# Patient Record
Sex: Female | Born: 1990 | Race: White | Hispanic: No | Marital: Single | Smoking: Current every day smoker
Health system: Southern US, Community
[De-identification: ages and names within clinical notes are randomized; demographics above are authoritative.]

## PROBLEM LIST (undated history)

## (undated) DIAGNOSIS — F32A Depression, unspecified: Secondary | ICD-10-CM

## (undated) DIAGNOSIS — N200 Calculus of kidney: Secondary | ICD-10-CM

## (undated) DIAGNOSIS — B019 Varicella without complication: Secondary | ICD-10-CM

## (undated) DIAGNOSIS — N390 Urinary tract infection, site not specified: Secondary | ICD-10-CM

## (undated) DIAGNOSIS — F329 Major depressive disorder, single episode, unspecified: Secondary | ICD-10-CM

## (undated) HISTORY — DX: Depression, unspecified: F32.A

## (undated) HISTORY — DX: Urinary tract infection, site not specified: N39.0

## (undated) HISTORY — DX: Varicella without complication: B01.9

## (undated) HISTORY — DX: Major depressive disorder, single episode, unspecified: F32.9

## (undated) HISTORY — DX: Calculus of kidney: N20.0

---

## 2010-06-15 HISTORY — PX: KIDNEY STONE SURGERY: SHX686

## 2015-09-20 ENCOUNTER — Encounter: Payer: Self-pay | Admitting: Primary Care

## 2015-09-20 ENCOUNTER — Ambulatory Visit (INDEPENDENT_AMBULATORY_CARE_PROVIDER_SITE_OTHER): Payer: 59 | Admitting: Primary Care

## 2015-09-20 VITALS — BP 108/64 | HR 87 | Temp 98.1°F | Ht 61.5 in | Wt 162.4 lb

## 2015-09-20 DIAGNOSIS — F32A Depression, unspecified: Secondary | ICD-10-CM

## 2015-09-20 DIAGNOSIS — F418 Other specified anxiety disorders: Secondary | ICD-10-CM | POA: Diagnosis not present

## 2015-09-20 DIAGNOSIS — F329 Major depressive disorder, single episode, unspecified: Secondary | ICD-10-CM | POA: Insufficient documentation

## 2015-09-20 DIAGNOSIS — F419 Anxiety disorder, unspecified: Principal | ICD-10-CM

## 2015-09-20 MED ORDER — FLUOXETINE HCL 20 MG PO TABS: 20.0000 mg | ORAL_TABLET | Freq: Every day | ORAL | Status: DC

## 2015-09-20 NOTE — Progress Notes (Signed)
Pre visit review using our clinic review tool, if applicable. No additional management support is needed unless otherwise documented below in the visit note. 

## 2015-09-20 NOTE — Patient Instructions (Addendum)
Start Fluoxetine 20 mg tablets for anxiety and depression. Start by taking 1/2 tablet for 6 days, then advance to 1 full tablet thereafter.   You will be contacted regarding your referral to Therapy.  Please let us know if you have not heard back within one week.   Schedule a follow up appointment in 6 weeks.   It was a pleasure to meet you today! Please don't hesitate to call me with any questions. Welcome to [facility]!

## 2015-09-20 NOTE — Progress Notes (Signed)
Subjective:    Patient ID: Rachel Mann, female    DOB: 1990/09/05, 25 y.o.   MRN: [id number]  HPI  [name] is a 25 year old female who presents today to establish care and discuss the problems mentioned below. Will obtain old records. Her last physical was years ago.   1) Anxiety and Depression: Diagnosed 4 years ago and was once managed on medication for which she cannot remember. She stopped taking her medication 4 years ago as she could no longer afford. She continues to struggle with depression and anxiety. She has difficulty getting out of bed and does not want to socialize, she feels anxious daily, has daily worry with difficulty controlling, and difficulty sleeping.  GAD 7 score of 16 and PHQ 9 score of 18 today. Denies SI/HI today, but has thoughts of this 1 month ago. She is interested in resuming medication as she was very successful with this in the past.    Review of Systems  Respiratory: Negative for shortness of breath.   Cardiovascular: Negative for chest pain.  Gastrointestinal:       Occasionally bloating with certain foods.  Genitourinary:       IUD, does spot occasionally  Allergic/Immunologic: Positive for environmental allergies.  Psychiatric/Behavioral: Positive for sleep disturbance. Negative for suicidal ideas. The patient is nervous/anxious.        Past Medical History  Diagnosis Date  . Chickenpox   . Depression   . Kidney stone   . UTI (lower urinary tract infection)     Social History   Social History  . Marital Status: Single    Spouse Name: N/A  . Number of Children: N/A  . Years of Education: N/A   Occupational History  . Not on file.   Social History Main Topics  . Smoking status: Current Every Day Smoker -- 0.25 packs/day    Types: Cigarettes  . Smokeless tobacco: Not on file  . Alcohol Use: 0.0 oz/week    0 Standard drinks or equivalent per week     Comment: soical  . Drug Use: No  . Sexual Activity: Not on file    Other Topics Concern  . Not on file   Social History Narrative   Divorced.   1 son.   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    Enjoys reading, crafts.     Past Surgical History  Procedure Laterality Date  . Kidney stone surgery  2012    Family History  Problem Relation Age of Onset  . Alcohol abuse Father   . Mental illness Maternal Grandmother   . Diabetes Maternal Grandmother     No Known Allergies  No current outpatient prescriptions on file prior to visit.   No current facility-administered medications on file prior to visit.    BP 108/64 mmHg  Pulse 87  Temp(Src) 98.1 F (36.7 C) (Oral)  Ht 5' 1.5" (1.562 m)  Wt 162 lb 6.4 oz (73.664 kg)  BMI 30.19 kg/m2  SpO2 99%    Objective:   Physical Exam  Constitutional: She is oriented to person, place, and time. She appears well-nourished.  Neck: Neck supple.  Cardiovascular: Normal rate and regular rhythm.   Pulmonary/Chest: Effort normal and breath sounds normal.  Neurological: She is alert and oriented to person, place, and time.  Skin: Skin is warm and dry.  Psychiatric: She has a normal mood and affect.  Assessment & Plan:

## 2015-09-20 NOTE — Assessment & Plan Note (Addendum)
Diagnosed years ago, successful on medication for which she cannot remember. No SI/HI thoughts today, although has experienced 1 month ago. Contracts to safety today. Given PHQ 9 score of 18 and GAD 7 score of 16, will initiate treatment.  Start Fluoxetine tablets. Patient is to take 1/2 tablet daily for 6 days, then advance to 1 full tablet thereafter. We discussed possible side effects of headache, GI upset, drowsiness, and SI/HI. If thoughts of SI/HI develop, we discussed to present to the emergency immediately. Patient verbalized understanding.   Follow up in 6 weeks for re-evaluation.

## 2015-10-18 ENCOUNTER — Ambulatory Visit (INDEPENDENT_AMBULATORY_CARE_PROVIDER_SITE_OTHER): Payer: 59 | Admitting: Psychology

## 2015-10-18 DIAGNOSIS — F331 Major depressive disorder, recurrent, moderate: Secondary | ICD-10-CM

## 2015-10-24 ENCOUNTER — Telehealth: Payer: Self-pay

## 2015-10-24 NOTE — Telephone Encounter (Signed)
I would continue to take 1/2 daily or every other day until she is seen.  Please call us back if symptoms worsen prior to her appointment.

## 2015-10-24 NOTE — Telephone Encounter (Signed)
Left detailed message on voicemail.  

## 2015-10-24 NOTE — Telephone Encounter (Signed)
Pt was seen 09/20/15 to establish care; pt started prozac; about 2 weeks after starting med pt began to feel anxious. Last night pt said anxiousness worsened; pt had panic attack and pt felt disorientated and disconnected from her body. Now pt feeling better but still feels anxious (not as anxious as last night). NO SI/HI. Pt has already taken Prozac 20 mg 1/2 tab this morning. Pt request cb with how to proceed. Pt has f/u appt scheduled with Mayra ReelKate Clark NP on 10/30/15. [facility]. If pt condition changes or worsens prior to cb pt will call [facility]. Mayra ReelKate Clark out of office until 10/29/15.

## 2015-10-30 ENCOUNTER — Encounter: Payer: Self-pay | Admitting: Primary Care

## 2015-10-30 ENCOUNTER — Ambulatory Visit (INDEPENDENT_AMBULATORY_CARE_PROVIDER_SITE_OTHER): Payer: 59 | Admitting: Primary Care

## 2015-10-30 VITALS — BP 112/74 | HR 86 | Temp 97.8°F | Ht 62.0 in | Wt 162.0 lb

## 2015-10-30 DIAGNOSIS — F418 Other specified anxiety disorders: Secondary | ICD-10-CM | POA: Diagnosis not present

## 2015-10-30 DIAGNOSIS — F419 Anxiety disorder, unspecified: Principal | ICD-10-CM

## 2015-10-30 DIAGNOSIS — F329 Major depressive disorder, single episode, unspecified: Secondary | ICD-10-CM

## 2015-10-30 MED ORDER — ESCITALOPRAM OXALATE 10 MG PO TABS: 10.0000 mg | ORAL_TABLET | Freq: Every day | ORAL | Status: DC

## 2015-10-30 NOTE — Progress Notes (Signed)
   Subjective:    Patient ID: Rachel Mann, female    DOB: 1991/04/21, 25 y.o.   MRN: [id number]  HPI  Ms. Rachel Mann is a 25 year old female who presents today for follow up of anxiety and depression. She was evaluated in April 2017 with a PHQ 9 sore of 18 and GAD 7 score of 16. She was initiated on Prozac 10 mg with an increase to 20 mg after 1 week.   She called into the office last week with reports of increased anxiousness since starting the Prozac. She was taking 1/2 tablet daily and was advised to take 1/2 tablet every other day until evaluated.  Since weaning off of the Prozac she's feeling better, but does continue to experience anxiety with difficulty sleeping. She denies feeling depressed, SI/HI, headaches, nausea. She's not tried anything OTC for her insomnia. She has difficulty falling and staying asleep.   Review of Systems  Respiratory: Negative for shortness of breath.   Cardiovascular: Negative for chest pain.  Psychiatric/Behavioral: Positive for sleep disturbance. Negative for suicidal ideas. The patient is nervous/anxious.        Past Medical History  Diagnosis Date  . Chickenpox   . Depression   . Kidney stone   . UTI (lower urinary tract infection)      Social History   Social History  . Marital Status: Single    Spouse Name: N/A  . Number of Children: N/A  . Years of Education: N/A   Occupational History  . Not on file.   Social History Main Topics  . Smoking status: Current Every Day Smoker -- 0.25 packs/day    Types: Cigarettes  . Smokeless tobacco: Not on file  . Alcohol Use: 0.0 oz/week    0 Standard drinks or equivalent per week     Comment: soical  . Drug Use: No  . Sexual Activity: Not on file   Other Topics Concern  . Not on file   Social History Narrative   Divorced.   1 son.   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    Enjoys reading, crafts.     Past Surgical History  Procedure Laterality  Date  . Kidney stone surgery  2012    Family History  Problem Relation Age of Onset  . Alcohol abuse Father   . Mental illness Maternal Grandmother   . Diabetes Maternal Grandmother     No Known Allergies  No current outpatient prescriptions on file prior to visit.   No current facility-administered medications on file prior to visit.    BP 112/74 mmHg  Pulse 86  Temp(Src) 97.8 F (36.6 C) (Oral)  Ht 5\' 2"  (1.575 m)  Wt 162 lb (73.483 kg)  BMI 29.62 kg/m2  SpO2 98%    Objective:   Physical Exam  Constitutional: She appears well-nourished.  Cardiovascular: Normal rate and regular rhythm.   Pulmonary/Chest: Effort normal and breath sounds normal.  Skin: Skin is warm and dry.  Psychiatric: She has a normal mood and affect.          Assessment & Plan:

## 2015-10-30 NOTE — Patient Instructions (Signed)
Stop taking Fluoxetine tablets.   Start Lexapro. Take 1/2 tablet daily for 8 days, then advance to 1 full tablet thereafter. This medication is to be taken everyday.   Try Melatonin to help with sleep until the Lexapro can take full effect. Melatonin can be purchased over the counter and should be taken 1 hour prior to bed.  Follow up in 6 weeks for re-evaluation of anxiety. Please call me sooner if you notice an increase in anxiety with this medication.   It was a pleasure to see you today!

## 2015-10-30 NOTE — Progress Notes (Signed)
Pre visit review using our clinic review tool, if applicable. No additional management support is needed unless otherwise documented below in the visit note. 

## 2015-10-30 NOTE — Assessment & Plan Note (Addendum)
Experienced increase in anxiety with Prozac, slowly has weaned off. Denies depression. GAD 7 score still 16 today. Also with insomnia. Denies SI/HI.  Will try low dose Lexapro. Patient is to take 1/2 tablet daily for 6 days, then advance to 1 full tablet thereafter. We discussed possible side effects of headache, GI upset, drowsiness, and SI/HI. If thoughts of SI/HI develop, we discussed to present to the emergency immediately. Patient verbalized understanding.   Will have her try OTC melatonin for insomnia.  Follow up in 6 weeks for re-evaluation.

## 2015-11-01 ENCOUNTER — Ambulatory Visit: Payer: 59 | Admitting: Primary Care

## 2015-11-01 ENCOUNTER — Ambulatory Visit (INDEPENDENT_AMBULATORY_CARE_PROVIDER_SITE_OTHER): Payer: 59 | Admitting: Psychology

## 2015-11-01 DIAGNOSIS — F4323 Adjustment disorder with mixed anxiety and depressed mood: Secondary | ICD-10-CM | POA: Diagnosis not present

## 2015-11-12 ENCOUNTER — Telehealth: Payer: Self-pay | Admitting: Primary Care

## 2015-11-12 ENCOUNTER — Encounter: Payer: Self-pay | Admitting: Medical Oncology

## 2015-11-12 ENCOUNTER — Emergency Department: Payer: 59

## 2015-11-12 ENCOUNTER — Emergency Department
Admission: EM | Admit: 2015-11-12 | Discharge: 2015-11-12 | Disposition: A | Discharge status: Home / Self Care | Payer: 59 | Attending: Student | Admitting: Student

## 2015-11-12 DIAGNOSIS — R0789 Other chest pain: Secondary | ICD-10-CM | POA: Insufficient documentation

## 2015-11-12 DIAGNOSIS — Z79899 Other long term (current) drug therapy: Secondary | ICD-10-CM | POA: Diagnosis not present

## 2015-11-12 DIAGNOSIS — F329 Major depressive disorder, single episode, unspecified: Secondary | ICD-10-CM | POA: Diagnosis not present

## 2015-11-12 DIAGNOSIS — F17210 Nicotine dependence, cigarettes, uncomplicated: Secondary | ICD-10-CM | POA: Insufficient documentation

## 2015-11-12 DIAGNOSIS — R079 Chest pain, unspecified: Secondary | ICD-10-CM | POA: Diagnosis present

## 2015-11-12 LAB — CBC
HCT: 41.7 % (ref 35.0–47.0)
HEMOGLOBIN: 14.1 g/dL (ref 12.0–16.0)
MCH: 29.8 pg (ref 26.0–34.0)
MCHC: 33.8 g/dL (ref 32.0–36.0)
MCV: 88.1 fL (ref 80.0–100.0)
PLATELETS: 214 10*3/uL (ref 150–440)
RBC: 4.73 MIL/uL (ref 3.80–5.20)
RDW: 13.4 % (ref 11.5–14.5)
WBC: 8.7 10*3/uL (ref 3.6–11.0)

## 2015-11-12 LAB — BASIC METABOLIC PANEL
ANION GAP: 5 (ref 5–15)
BUN: 11 mg/dL (ref 6–20)
CHLORIDE: 106 mmol/L (ref 101–111)
CO2: 26 mmol/L (ref 22–32)
CREATININE: 0.74 mg/dL (ref 0.44–1.00)
Calcium: 8.9 mg/dL (ref 8.9–10.3)
GFR calc non Af Amer: >60 mL/min (ref >60)
Glucose, Bld: 117 mg/dL — ABNORMAL HIGH (ref 65–99)
POTASSIUM: 3.7 mmol/L (ref 3.5–5.1)
SODIUM: 137 mmol/L (ref 135–145)

## 2015-11-12 LAB — POCT PREGNANCY, URINE: Preg Test, Ur: NEGATIVE

## 2015-11-12 LAB — TROPONIN I: Troponin I: 0.03 ng/mL (ref <0.031)

## 2015-11-12 MED ORDER — IBUPROFEN 600 MG PO TABS
600.0000 mg | ORAL_TABLET | Freq: Once | ORAL | Status: AC
  Administered 2015-11-12: 600 mg via ORAL

## 2015-11-12 MED ORDER — IBUPROFEN 600 MG PO TABS
ORAL_TABLET | ORAL | Status: AC
  Administered 2015-11-12: 600 mg via ORAL
  Filled 2015-11-12: qty 1

## 2015-11-12 MED ORDER — IBUPROFEN 600 MG PO TABS
600.0000 mg | ORAL_TABLET | Freq: Four times a day (QID) | ORAL | Status: DC | PRN
Start: 2015-11-12 — End: 2015-11-28

## 2015-11-12 NOTE — Telephone Encounter (Signed)
Patient Name: Rachel Mann DOB: Oct 20, 1990 Initial Comment Caller states been having a lot of chest pain Nurse Assessment Nurse: Yetta BarreJones, RN, Miranda Date/Time (Eastern Time): 11/12/2015 9:44:41 AM Confirm and document reason for call. If symptomatic, describe symptoms. You must click the next button to save text entered. ---Caller states she has been having pain in her chest off and on since Thursday. She is having pain right now. Has the patient traveled out of the country within the last 30 days? ---Not Applicable Does the patient have any new or worsening symptoms? ---Yes Will a triage be completed? ---Yes Related visit to physician within the last 2 weeks? ---No Does the PT have any chronic conditions? (i.e. diabetes, asthma, etc.) ---No Is the patient pregnant or possibly pregnant? (Ask all females between the ages of 2212-55) ---No Is this a behavioral health or substance abuse call? ---No Guidelines Guideline Title Affirmed Question Affirmed Notes Chest Pain Pain also present in shoulder(s) or arm(s) or jaw (Exception: pain is clearly made worse by movement) Final Disposition User Go to ED Now Yetta BarreJones, RN, Miranda Referrals [facility] - ED Disagree/Comply: Comply

## 2015-11-12 NOTE — ED Notes (Signed)
Pt reports chest pain for 1 week with left arm pain, pt reports pain is intermittent

## 2015-11-12 NOTE — ED Notes (Signed)
Pt reports burning sensation to left side of chest off and on x 1 week. NAD noted.

## 2015-11-12 NOTE — Discharge Instructions (Signed)

## 2015-11-12 NOTE — Telephone Encounter (Signed)
Noted. Appears she is in the ED at present. This could have likely been handled in the office, please call her tomorrow (05/31) to check up.

## 2015-11-12 NOTE — ED Provider Notes (Addendum)
[facility] Emergency Department Provider Note   ____________________________________________  Time seen: Approximately 1:52 PM  I have reviewed the triage vital signs and the nursing notes.   HISTORY  Chief Complaint Chest Pain    HPI Rachel Mann is a 25 y.o. female with history of anxiety and depression who presents for evaluation of one week of intermittent left-sided burning chest pain radiating into the left shoulder, intermittent, currently moderate, usually gradual onset and lasting for a few minutes to a few hours. Patient reports that she has had constant burning left-sided chest pain since 9 AM this morning, it has been waxing and waning but has never gone away. She reports that when she moves her left arm/shoulder it is worse. Pain is not worse with exertion, does not radiate to the back or down towards the feet, is not sharp/ripping or tearing in nature, not pleuritic, not associated with shortness of breath, sudden sweating, nausea or vomiting. No fevers or chills. She has had mild diarrhea as well as cough over the past several days. No family history of early coronary artery disease. No family history of sudden cardiac death. She denies any risk factors for PE including no exogenous estrogen use, no hemoptysis, no leg swelling, no recent prolonged period of immobilization or recent surgeries.   Past Medical History  Diagnosis Date  . Chickenpox   . Depression   . Kidney stone   . UTI (lower urinary tract infection)     Patient Active Problem List   Diagnosis Date Noted  . Anxiety and depression 09/20/2015    Past Surgical History  Procedure Laterality Date  . Kidney stone surgery  2012    Current Outpatient Rx  Name  Route  Sig  Dispense  Refill  . escitalopram (LEXAPRO) 10 MG tablet   Oral   Take 1 tablet (10 mg total) by mouth daily.   30 tablet   3     Allergies Review of patient's allergies indicates no known  allergies.  Family History  Problem Relation Age of Onset  . Alcohol abuse Father   . Mental illness Maternal Grandmother   . Diabetes Maternal Grandmother     Social History Social History  Substance Use Topics  . Smoking status: Current Every Day Smoker -- 0.25 packs/day    Types: Cigarettes  . Smokeless tobacco: None  . Alcohol Use: 0.0 oz/week    0 Standard drinks or equivalent per week     Comment: social    Review of Systems Constitutional: No fever/chills Eyes: No visual changes. ENT: No sore throat. Cardiovascular: + chest pain. Respiratory: Denies shortness of breath. Gastrointestinal: No abdominal pain.  No nausea, no vomiting.  No diarrhea.  No constipation. Genitourinary: Negative for dysuria. Musculoskeletal: Negative for back pain. Skin: Negative for rash. Neurological: Negative for headaches, focal weakness or numbness.  10-point ROS otherwise negative.  ____________________________________________   PHYSICAL EXAM:  VITAL SIGNS: ED Triage Vitals  Enc Vitals Group     BP 11/12/15 1331 120/72 mmHg     Pulse Rate 11/12/15 1331 89     Resp 11/12/15 1331 16     Temp --      Temp src --      SpO2 11/12/15 1331 100 %     Weight 11/12/15 1105 160 lb (72.576 kg)     Height 11/12/15 1105 5\' 1"  (1.549 m)     Head Cir --      Peak Flow --  Pain Score 11/12/15 1105 3     Pain Loc --      Pain Edu? --      Excl. in GC? --     Constitutional: Alert and oriented. Well appearing and in no acute distress. Eyes: Conjunctivae are normal. PERRL. EOMI. Head: Atraumatic. Nose: No congestion/rhinnorhea. Mouth/Throat: Mucous membranes are moist.  Oropharynx non-erythematous. Neck: No stridor. Supple without meningismus. Cardiovascular: Normal rate, regular rhythm. Grossly normal heart sounds.  Good peripheral circulation. Respiratory: Normal respiratory effort.  No retractions. Lungs CTAB. Gastrointestinal: Soft and nontender. No distention. No CVA  tenderness. Genitourinary: deferred Musculoskeletal: No lower extremity tenderness nor edema.  No joint effusions. No calf swelling/asymmetry/tenderness. Reproducible point tenderness to palpation in the left anterior chest wall, palpation causes the patient to grimace and move away from the stimulus and she confirms that that is the pain that she has been experiencing. Neurologic:  Normal speech and language. No gross focal neurologic deficits are appreciated. No gait instability. Skin:  Skin is warm, dry and intact. No rash noted. Psychiatric: Mood and affect are normal. Speech and behavior are normal.  ____________________________________________   LABS (all labs ordered are listed, but only abnormal results are displayed)  Labs Reviewed  BASIC METABOLIC PANEL - Abnormal; Notable for the following:    Glucose, Bld 117 (*)    All other components within normal limits  CBC  TROPONIN I  POC URINE PREG, ED  POCT PREGNANCY, URINE   ____________________________________________  EKG  ED ECG REPORT I, Gayla Doss, the attending physician, personally viewed and interpreted this ECG.   Date: 11/12/2015  EKG Time: 11:06  Rate: 98  Rhythm: normal EKG, normal sinus rhythm  Axis: normal  Intervals:none  ST&T Change: No acute ST elevation.  ____________________________________________  RADIOLOGY  CXR  FINDINGS: The heart size and mediastinal contours are within normal limits. Both lungs are clear. The visualized skeletal structures are unremarkable.  IMPRESSION: No active cardiopulmonary disease. ____________________________________________   PROCEDURES  Procedure(s) performed: None  Critical Care performed: No  ____________________________________________   INITIAL IMPRESSION / ASSESSMENT AND PLAN / ED COURSE  Pertinent labs & imaging results that were available during my care of the patient were reviewed by me and considered in my medical decision making (see  chart for details).  Rachel Mann is a 25 y.o. female with history of anxiety and depression who presents for evaluation of one week of intermittent left-sided burning chest pain radiating into the left shoulder, intermittent. On exam, she is very well-appearing and in no acute distress, vital signs are stable, she is afebrile. She has reproducible anterior chest wall tenderness to palpation and I suspect musculoskeletal chest pain. Her EKG is normal, heart score 1 secondary to tobacco use, low risk for ACS,  troponin is negative. She is perc negative and I doubt PE. Critical picture not consistent with acute aortic dissection. Discussed treatment with anti-inflammatories, need for close PCP follow-up and she is, comfortable with the discharge plan. CBC and BMP are unremarkable, negative pregnancy test. ____________________________________________   FINAL CLINICAL IMPRESSION(S) / ED DIAGNOSES  Final diagnoses:  Acute chest wall pain      NEW MEDICATIONS STARTED DURING THIS VISIT:  New Prescriptions   No medications on file     Note:  This document was prepared using Dragon voice recognition software and may include unintentional dictation errors.    Gayla Doss, MD 11/12/15 1445  Gayla Doss, MD 11/12/15 [phone]

## 2015-11-13 ENCOUNTER — Ambulatory Visit (INDEPENDENT_AMBULATORY_CARE_PROVIDER_SITE_OTHER): Payer: 59 | Admitting: Psychology

## 2015-11-13 DIAGNOSIS — F330 Major depressive disorder, recurrent, mild: Secondary | ICD-10-CM | POA: Diagnosis not present

## 2015-11-13 NOTE — Telephone Encounter (Signed)
Spoken and asked patient how she is feeling today. Patient stated she feel much better than yesterday. Patient is taking some advil and so far doing okay.

## 2015-11-13 NOTE — Telephone Encounter (Signed)
Noted and glad to hear! 

## 2015-11-27 ENCOUNTER — Ambulatory Visit: Payer: 59 | Admitting: Psychology

## 2015-11-28 ENCOUNTER — Emergency Department
Admission: EM | Admit: 2015-11-28 | Discharge: 2015-11-28 | Disposition: A | Discharge status: Home / Self Care | Payer: 59 | Attending: Emergency Medicine | Admitting: Emergency Medicine

## 2015-11-28 ENCOUNTER — Encounter: Payer: Self-pay | Admitting: *Deleted

## 2015-11-28 DIAGNOSIS — F419 Anxiety disorder, unspecified: Secondary | ICD-10-CM | POA: Diagnosis present

## 2015-11-28 DIAGNOSIS — F329 Major depressive disorder, single episode, unspecified: Secondary | ICD-10-CM | POA: Diagnosis not present

## 2015-11-28 DIAGNOSIS — G44209 Tension-type headache, unspecified, not intractable: Secondary | ICD-10-CM | POA: Diagnosis not present

## 2015-11-28 DIAGNOSIS — F17210 Nicotine dependence, cigarettes, uncomplicated: Secondary | ICD-10-CM | POA: Insufficient documentation

## 2015-11-28 DIAGNOSIS — Z79899 Other long term (current) drug therapy: Secondary | ICD-10-CM | POA: Insufficient documentation

## 2015-11-28 MED ORDER — IBUPROFEN 800 MG PO TABS: 800.0000 mg | ORAL_TABLET | Freq: Three times a day (TID) | ORAL | Status: DC | PRN

## 2015-11-28 NOTE — Discharge Instructions (Signed)
Tension Headache A tension headache is pain, pressure, or aching that is felt over the front and sides of your head. These headaches can last from 30 minutes to several days. HOME CARE Managing Pain  Take over-the-counter and prescription medicines only as told by your doctor.  Lie down in a dark, quiet room when you have a headache.  If directed, apply ice to your head and neck area:  Put ice in a plastic bag.  Place a towel between your skin and the bag.  Leave the ice on for 20 minutes, 2-3 times per day.  Use a heating pad or a hot shower to apply heat to your head and neck area as told by your doctor. Eating and Drinking  Eat meals on a regular schedule.  Do not drink a lot of alcohol.  Do not use a lot of caffeine, or stop using caffeine. General Instructions  Keep all follow-up visits as told by your doctor. This is important.  Keep a journal to find out if certain things bring on headaches. For example, write down:  What you eat and drink.  How much sleep you get.  Any change to your diet or medicines.  Try getting a massage, or doing other things that help you to relax.  Lessen stress.  Sit up straight. Do not tighten (tense) your muscles.  Do not use tobacco products. This includes cigarettes, chewing tobacco, or e-cigarettes. If you need help quitting, ask your doctor.  Exercise regularly as told by your doctor.  Get enough sleep. This may mean 7-9 hours of sleep. GET HELP IF:  Your symptoms are not helped by medicine.  You have a headache that feels different from your usual headache.  You feel sick to your stomach (nauseous) or you throw up (vomit).  You have a fever. GET HELP RIGHT AWAY IF:  Your headache becomes very bad.  You keep throwing up.  You have a stiff neck.  You have trouble seeing.  You have trouble speaking.  You have pain in your eye or ear.  Your muscles are weak or you lose muscle control.  You lose your balance  or you have trouble walking.  You feel like you will pass out (faint) or you pass out.  You have confusion.   This information is not intended to replace advice given to you by your health care provider. Make sure you discuss any questions you have with your health care provider.   Document Released: 08/26/2009 Document Revised: 02/20/2015 Document Reviewed: 09/24/2014 Elsevier Interactive Patient Education 2016 [employer]  Generalized Anxiety Disorder Generalized anxiety disorder (GAD) is a mental disorder. It interferes with life functions, including relationships, work, and school. GAD is different from normal anxiety, which everyone experiences at some point in their lives in response to specific life events and activities. Normal anxiety actually helps us prepare for and get through these life events and activities. Normal anxiety goes away after the event or activity is over.  GAD causes anxiety that is not necessarily related to specific events or activities. It also causes excess anxiety in proportion to specific events or activities. The anxiety associated with GAD is also difficult to control. GAD can vary from mild to severe. People with severe GAD can have intense waves of anxiety with physical symptoms (panic attacks).  SYMPTOMS The anxiety and worry associated with GAD are difficult to control. This anxiety and worry are related to many life events and activities and also occur more days  than not for 6 months or longer. People with GAD also have three or more of the following symptoms (one or more in children):  Restlessness.   Fatigue.  Difficulty concentrating.   Irritability.  Muscle tension.  Difficulty sleeping or unsatisfying sleep. DIAGNOSIS GAD is diagnosed through an assessment by your health care provider. Your health care provider will ask you questions aboutyour mood,physical symptoms, and events in your life. Your health care provider may ask you about  your medical history and use of alcohol or drugs, including prescription medicines. Your health care provider may also do a physical exam and blood tests. Certain medical conditions and the use of certain substances can cause symptoms similar to those associated with GAD. Your health care provider may refer you to a mental health specialist for further evaluation. TREATMENT The following therapies are usually used to treat GAD:   Medication. Antidepressant medication usually is prescribed for long-term daily control. Antianxiety medicines may be added in severe cases, especially when panic attacks occur.   Talk therapy (psychotherapy). Certain types of talk therapy can be helpful in treating GAD by providing support, education, and guidance. A form of talk therapy called cognitive behavioral therapy can teach you healthy ways to think about and react to daily life events and activities.  Stress managementtechniques. These include yoga, meditation, and exercise and can be very helpful when they are practiced regularly. A mental health specialist can help determine which treatment is best for you. Some people see improvement with one therapy. However, other people require a combination of therapies.   This information is not intended to replace advice given to you by your health care provider. Make sure you discuss any questions you have with your health care provider.   Document Released: 09/26/2012 Document Revised: 06/22/2014 Document Reviewed: 09/26/2012 Elsevier Interactive Patient Education [employer]   He may continue ibuprofen as needed for return of headache or neck pain. He may try Tylenol PM or Advil PM for insomnia. Recommend following up with your physician for further evaluation.

## 2015-11-28 NOTE — ED Provider Notes (Signed)
[facility] Emergency Department Provider Note  ____________________________________________  Time seen: Approximately 9:24 PM  I have reviewed the triage vital signs and the nursing notes.   HISTORY  Chief Complaint Headache    HPI Rachel Mann is a 25 y.o. female who presents with several complaints. She has had a headache on and off for 2 weeks. Started after going down a water slide. Has also had some neck pain.  she feelsthe headache has made her more anxious, especially in the evening. Has affected her sleeping. Currently the headache has resolved. Recently placed on Lexapro for anxiety. She feels is helping. No chest pain, shortness breath, fevers or chills. She has strong family history of anxiety disorder.   Past Medical History  Diagnosis Date  . Chickenpox   . Depression   . Kidney stone   . UTI (lower urinary tract infection)     Patient Active Problem List   Diagnosis Date Noted  . Anxiety and depression 09/20/2015    Past Surgical History  Procedure Laterality Date  . Kidney stone surgery  2012    Current Outpatient Rx  Name  Route  Sig  Dispense  Refill  . escitalopram (LEXAPRO) 10 MG tablet   Oral   Take 1 tablet (10 mg total) by mouth daily.   30 tablet   3   . ibuprofen (ADVIL,MOTRIN) 800 MG tablet   Oral   Take 1 tablet (800 mg total) by mouth every 8 (eight) hours as needed.   15 tablet   0     Allergies Review of patient's allergies indicates no known allergies.  Family History  Problem Relation Age of Onset  . Alcohol abuse Father   . Mental illness Maternal Grandmother   . Diabetes Maternal Grandmother     Social History Social History  Substance Use Topics  . Smoking status: Current Every Day Smoker -- 0.25 packs/day    Types: Cigarettes  . Smokeless tobacco: None  . Alcohol Use: 0.0 oz/week    0 Standard drinks or equivalent per week     Comment: social    Review of Systems Constitutional:  No fever/chills Eyes: No visual changes. ENT: No sore throat. Cardiovascular: Denies chest pain. Respiratory: Denies shortness of breath. Gastrointestinal: No abdominal pain.  No nausea, no vomiting.  No diarrhea.  No constipation. Genitourinary: Negative for dysuria. Musculoskeletal: Negative for back pain. Skin: Negative for rash. Neurological: Negative for headaches, focal weakness or numbness. 10-point ROS otherwise negative.  ____________________________________________   PHYSICAL EXAM:  VITAL SIGNS: ED Triage Vitals  Enc Vitals Group     BP 11/28/15 2014 113/59 mmHg     Pulse Rate 11/28/15 2011 97     Resp 11/28/15 2011 18     Temp 11/28/15 2011 98.5 F (36.9 C)     Temp Source 11/28/15 2011 Oral     SpO2 11/28/15 2011 99 %     Weight 11/28/15 2011 168 lb (76.204 kg)     Height 11/28/15 2011 5\' 1"  (1.549 m)     Head Cir --      Peak Flow --      Pain Score 11/28/15 2013 5     Pain Loc --      Pain Edu? --      Excl. in GC? --     Constitutional: Alert and oriented. Well appearing and in no acute distress. Eyes: Conjunctivae are normal. PERRL. EOMI. Ears:  Clear with normal landmarks. No erythema. Head:  Atraumatic. Nose: No congestion/rhinnorhea. Mouth/Throat: Mucous membranes are moist.  Oropharynx non-erythematous. No lesions. Neck:  Supple.  No adenopathy.  No cervical tenderness or paracervical tenderness. Cardiovascular: Normal rate, regular rhythm. Grossly normal heart sounds.  Good peripheral circulation. Respiratory: Normal respiratory effort.  No retractions. Lungs CTAB. Musculoskeletal: Nml ROM of upper and lower extremity joints. Neurologic:  Normal speech and language. No gross focal neurologic deficits are appreciated. No gait instability. Skin:  Skin is warm, dry and intact. No rash noted. Psychiatric: Mood and affect are normal. Speech and behavior are normal.  ____________________________________________   LABS (all labs ordered are listed,  but only abnormal results are displayed)  Labs Reviewed - No data to display ____________________________________________  EKG   ____________________________________________  RADIOLOGY   ____________________________________________   PROCEDURES  Procedure(s) performed: None  Critical Care performed: No  ____________________________________________   INITIAL IMPRESSION / ASSESSMENT AND PLAN / ED COURSE  Pertinent labs & imaging results that were available during my care of the patient were reviewed by me and considered in my medical decision making (see chart for details).  25 year old with intermittent headaches over the last 2 weeks. Not present currently. Started after going down a water slide. She did not actually hit her head but believes her neck was strained. She is actually getting better. Recommend ibuprofen as needed. She also expressed concern about anxiety. Currently on Lexapro. Encouraged follow-up with her primary physician for evaluation. No current suicidal or homicidal ideations. ____________________________________________   FINAL CLINICAL IMPRESSION(S) / ED DIAGNOSES  Final diagnoses:  Anxiety  Tension-type headache, not intractable, unspecified chronicity pattern      Ignacia Bayley, PA-C 11/28/15 2134  Rockne Menghini, MD 11/28/15 2250

## 2015-11-28 NOTE — ED Notes (Signed)
Pt reports she has a headache, blurred vision.  Pt took tylenol without relief.   Pt states she has been having panic attacks for 2 weeks.  Denies SI or HI.  Pt denies drugs or etoh use.  Pt calm and cooperative.

## 2015-11-28 NOTE — ED Notes (Signed)
Patient states she feels as though her anxiety is exceptionally high and unmanageable at the moment.

## 2015-12-13 ENCOUNTER — Ambulatory Visit (INDEPENDENT_AMBULATORY_CARE_PROVIDER_SITE_OTHER): Payer: 59 | Admitting: Primary Care

## 2015-12-13 ENCOUNTER — Encounter: Payer: Self-pay | Admitting: Primary Care

## 2015-12-13 VITALS — BP 118/74 | HR 104 | Temp 97.7°F | Ht 61.0 in | Wt 159.1 lb

## 2015-12-13 DIAGNOSIS — F418 Other specified anxiety disorders: Secondary | ICD-10-CM

## 2015-12-13 DIAGNOSIS — F329 Major depressive disorder, single episode, unspecified: Secondary | ICD-10-CM

## 2015-12-13 DIAGNOSIS — J302 Other seasonal allergic rhinitis: Secondary | ICD-10-CM | POA: Diagnosis not present

## 2015-12-13 DIAGNOSIS — F419 Anxiety disorder, unspecified: Principal | ICD-10-CM

## 2015-12-13 MED ORDER — BUPROPION HCL ER (XL) 150 MG PO TB24: 150.0000 mg | ORAL_TABLET | Freq: Every day | ORAL | Status: DC

## 2015-12-13 MED ORDER — HYDROXYZINE HCL 10 MG PO TABS: 10.0000 mg | ORAL_TABLET | Freq: Every evening | ORAL | Status: DC | PRN

## 2015-12-13 NOTE — Progress Notes (Signed)
Pre visit review using our clinic review tool, if applicable. No additional management support is needed unless otherwise documented below in the visit note. 

## 2015-12-13 NOTE — Assessment & Plan Note (Signed)
Postnasal drip, cough, ear discomfort for the past 2 weeks. Respiratory exam unremarkable and does not appear ill. We'll have her try Claritin daily for the next 4 weeks and report back if no improvement in symptoms.

## 2015-12-13 NOTE — Assessment & Plan Note (Signed)
Increased anxiety at night on Lexapro, also suspect Lexapro is contributing to daily intermittent headaches. Will wean her off Lexapro and switch to Wellbutrin. We will also have her continue therapy with Dr. parent and encouraged her to schedule a follow-up appointment. We'll also provide low-dose hydroxy sign to use at bedtime for any anxiety or insomnia. Drowsiness precautions provided.  Exam stable, denies SI/HI.  Follow-up in 6 weeks for reevaluation.

## 2015-12-13 NOTE — Progress Notes (Signed)
Subjective:    Patient ID: Rachel Mann, female    DOB: 1990/09/25, 25 y.o.   MRN: [id number]  HPI  Rachel Mann is a 25 year old female who presents today for follow up of anxiety and depression. She was originally evaluated in April 2017 for anxiety and depression and initiated on Prozac. She followed up in May with complaints of increased anxiety on Prozac and was switched to Lexapro. She was also reporting insomnia so low dose melatonin was recommended.  Since her last visit she continues to feel anxiety on the Lexapro, in fact worse at night. Her depression has improved. She experiences her anxiety and panic attacks at night when it gets dark outside and will feel disoriented. She's noticed improvement in sleep with Melatonin. During the daytime hours her anxiety is low. She has met with Dr. Rexene Edison for several visits and believes it's going well.   She has been experiencing bothersome intermittent headaches since starting the Lexapro. She was evaluated in the emergency department for headaches several weeks ago and had an unremarkable examination and evaluation. She was determined to have anxiety tension type headache. She is also dealing with allergy type symptoms.  Review of Systems  Constitutional: Negative for fever.  HENT: Positive for congestion and postnasal drip.   Respiratory: Negative for cough.   Gastrointestinal: Negative for nausea.  Neurological: Positive for dizziness and headaches.  Psychiatric/Behavioral: Positive for sleep disturbance. Negative for suicidal ideas. The patient is nervous/anxious.        Past Medical History  Diagnosis Date  . Chickenpox   . Depression   . Kidney stone   . UTI (lower urinary tract infection)      Social History   Social History  . Marital Status: Single    Spouse Name: N/A  . Number of Children: N/A  . Years of Education: N/A   Occupational History  . Not on file.   Social History Main Topics  . Smoking status:  Current Every Day Smoker -- 0.25 packs/day    Types: Cigarettes  . Smokeless tobacco: Not on file  . Alcohol Use: 0.0 oz/week    0 Standard drinks or equivalent per week     Comment: social  . Drug Use: No  . Sexual Activity: Not on file   Other Topics Concern  . Not on file   Social History Narrative   Divorced.   1 son.   Works in Radiation protection practitioner.    In school for psychologist.    Enjoys reading, crafts.     Past Surgical History  Procedure Laterality Date  . Kidney stone surgery  2012    Family History  Problem Relation Age of Onset  . Alcohol abuse Father   . Mental illness Maternal Grandmother   . Diabetes Maternal Grandmother     No Known Allergies  Current Outpatient Prescriptions on File Prior to Visit  Medication Sig Dispense Refill  . ibuprofen (ADVIL,MOTRIN) 800 MG tablet Take 1 tablet (800 mg total) by mouth every 8 (eight) hours as needed. 15 tablet 0   No current facility-administered medications on file prior to visit.    BP 118/74 mmHg  Pulse 104  Temp(Src) 97.7 F (36.5 C) (Oral)  Ht 5' 1"  (1.549 m)  Wt 159 lb 1.9 oz (72.176 kg)  BMI 30.08 kg/m2  SpO2 98%    Objective:   Physical Exam  Constitutional: She appears well-nourished.  HENT:  Right Ear: Tympanic membrane and ear canal normal.  Left Ear: Tympanic membrane and ear canal normal.  Nose: Right sinus exhibits no maxillary sinus tenderness and no frontal sinus tenderness. Left sinus exhibits no maxillary sinus tenderness and no frontal sinus tenderness.  Mouth/Throat: Oropharynx is clear and moist.  Eyes: Conjunctivae and EOM are normal.  Neck: Neck supple.  Cardiovascular: Normal rate and regular rhythm.   Pulmonary/Chest: Effort normal and breath sounds normal. She has no wheezes. She has no rales.  Lymphadenopathy:    She has no cervical adenopathy.  Neurological: No cranial nerve deficit.  Skin: Skin is warm and dry.  Psychiatric: She has a normal mood and  affect.          Assessment & Plan:

## 2015-12-13 NOTE — Patient Instructions (Addendum)
You will need to wean down on the Lexapro. Take 1/2 tablet by mouth daily for 1 week then stop.  Start Bupropion XL 150 mg tablets. Take 1 tablet by mouth every morning.  You may use the hydroxyzine as needed for panic attacks/anxiety at night. Caution as this may cause drowsiness.  Follow up in 6 weeks for a physical and re-evaluation. Ensure you come to this appointment fasting. Water and black coffee only.  Schedule an appointment with Dr. Laymond PurserPerrin.  It was a pleasure to see you today!

## 2015-12-27 ENCOUNTER — Telehealth: Payer: Self-pay | Admitting: Primary Care

## 2015-12-27 ENCOUNTER — Encounter: Payer: Self-pay | Admitting: Emergency Medicine

## 2015-12-27 ENCOUNTER — Ambulatory Visit
Admission: EM | Admit: 2015-12-27 | Discharge: 2015-12-27 | Disposition: A | Discharge status: Home / Self Care | Payer: 59 | Attending: Emergency Medicine | Admitting: Emergency Medicine

## 2015-12-27 DIAGNOSIS — J302 Other seasonal allergic rhinitis: Secondary | ICD-10-CM | POA: Diagnosis not present

## 2015-12-27 DIAGNOSIS — H6983 Other specified disorders of Eustachian tube, bilateral: Secondary | ICD-10-CM | POA: Diagnosis not present

## 2015-12-27 DIAGNOSIS — S161XXD Strain of muscle, fascia and tendon at neck level, subsequent encounter: Secondary | ICD-10-CM

## 2015-12-27 DIAGNOSIS — H8113 Benign paroxysmal vertigo, bilateral: Secondary | ICD-10-CM

## 2015-12-27 MED ORDER — AMOXICILLIN-POT CLAVULANATE 875-125 MG PO TABS: 1.0000 | ORAL_TABLET | Freq: Two times a day (BID) | ORAL | Status: DC

## 2015-12-27 MED ORDER — FLUTICASONE PROPIONATE 50 MCG/ACT NA SUSP: 1.0000 | Freq: Two times a day (BID) | NASAL | Status: DC

## 2015-12-27 MED ORDER — MECLIZINE HCL 25 MG PO TABS: 25.0000 mg | ORAL_TABLET | Freq: Four times a day (QID) | ORAL | Status: AC | PRN

## 2015-12-27 MED ORDER — SALINE SPRAY 0.65 % NA SOLN: 2.0000 | NASAL | Status: DC

## 2015-12-27 NOTE — Telephone Encounter (Signed)
Per chart review tab pt is at [facility].

## 2015-12-27 NOTE — Telephone Encounter (Signed)
Patient Name: Rachel Mann  DOB: 08/16/1990    Initial Comment Caller says she has had headaches every day on and off for about a month, dizziness, and is seeing a lot of floaters and can't concentrate on looking at one object, and her hands have been shaking.    Nurse Assessment  Nurse: Stefano GaulStringer, RN, Dwana CurdVera Date/Time (Eastern Time): 12/27/2015 11:46:30 AM  Confirm and document reason for call. If symptomatic, describe symptoms. You must click the next button to save text entered. ---Caller states she has intermittent headache for a month. She has been dizzy off and on for a week. She has seen floaters about a month. Has intermittent shaking of her hands for a month.  Has the patient traveled out of the country within the last 30 days? ---Not Applicable  Does the patient have any new or worsening symptoms? ---Yes  Will a triage be completed? ---Yes  Related visit to physician within the last 2 weeks? ---No  Does the PT have any chronic conditions? (i.e. diabetes, asthma, etc.) ---Yes  List chronic conditions. ---gestational diabetes; anxiety  Is the patient pregnant or possibly pregnant? (Ask all females between the ages of 3412-55) ---No  Is this a behavioral health or substance abuse call? ---No     Guidelines    Guideline Title Affirmed Question Affirmed Notes  Headache [1] MILD-MODERATE headache AND [2] present > 72 hours   Vision Loss or Change Many floaters in the eye   Vision Loss or Change Many floaters in the eye    Final Disposition User   See Physician within 24 Hours BaysideStringer, Charity fundraiserN, Vera    Comments  [place] in [place] or [place]. Pt states [facility].   Referrals  GO TO FACILITY OTHER - SPECIFY  GO TO FACILITY OTHER - SPECIFY   Disagree/Comply: Comply    Disagree/Comply: Comply    Disagree/Comply: Comply

## 2015-12-27 NOTE — Telephone Encounter (Signed)
Noted. Please check on patient Monday July 17th.

## 2015-12-27 NOTE — ED Provider Notes (Signed)
CSN: [id number]     Arrival date & time 12/27/15  1231 History   First MD Initiated Contact with Patient 12/27/15 1304     Chief Complaint  Patient presents with  . 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  Ocassional bright spot in vision resolves denied visual field loss or blurriness  PMHx gestational diabetes PCM visit pending labs  The history is provided by the patient.    Past Medical History  Diagnosis Date  . Chickenpox   . Depression   . Kidney stone   . UTI (lower urinary tract infection)    Past Surgical History  Procedure Laterality Date  . Kidney stone surgery  2012   Family History  Problem Relation Age of Onset  . Alcohol abuse Father   . Mental illness Maternal Grandmother   . Diabetes Maternal Grandmother    Social History  Substance Use Topics  . Smoking status: Current Every Day Smoker -- 0.25 packs/day    Types: Cigarettes  . Smokeless tobacco: None  . Alcohol Use: 0.0 oz/week    0 Standard drinks or equivalent per week     Comment: social   OB History    No data available     Review of Systems  Constitutional: Negative for fever, chills, diaphoresis, activity change, appetite change, fatigue and unexpected weight change.  HENT: Positive for congestion, ear pain, postnasal drip and sinus pressure. Negative for dental problem, drooling, ear discharge, facial swelling, hearing loss, mouth sores, nosebleeds, rhinorrhea, sneezing, sore throat, tinnitus, trouble swallowing and voice change.   Eyes: Negative for photophobia, pain,  discharge, redness, itching and visual disturbance.  Respiratory: Negative for cough, choking, chest tightness, shortness of breath, wheezing and stridor.   Cardiovascular: Negative for chest pain, palpitations and leg swelling.  Gastrointestinal: Negative for nausea, vomiting, abdominal pain, diarrhea, constipation, blood in stool and abdominal distention.  Endocrine: Negative for cold intolerance and heat intolerance.  Genitourinary: Negative for dysuria, hematuria and difficulty urinating.  Musculoskeletal: Negative for myalgias, back pain, joint swelling, arthralgias, gait problem, neck pain and neck stiffness.  Skin: Negative for color change, pallor, rash and wound.  Allergic/Immunologic: Positive for environmental allergies. Negative for food allergies.  Neurological: Positive for dizziness and headaches. Negative for tremors, seizures, syncope, facial asymmetry, speech difficulty, weakness, light-headedness and numbness.  Hematological: Negative for adenopathy. Does not bruise/bleed easily.  Psychiatric/Behavioral: Negative for behavioral problems, confusion, sleep disturbance and agitation.    Allergies  Review of patient's allergies indicates no known allergies.  Home Medications   Prior to Admission medications   Medication Sig Start Date End Date Taking? Authorizing Provider  amoxicillin-clavulanate (AUGMENTIN) 875-125 MG tablet Take 1 tablet by mouth every 12 (twelve) hours. 12/27/15   Barbaraann Barthel, NP  buPROPion (WELLBUTRIN XL) 150 MG 24 hr tablet Take 1 tablet (150 mg total) by mouth daily. 12/13/15   Doreene Nest, NP  fluticasone (FLONASE) 50 MCG/ACT nasal spray Place 1 spray into both nostrils 2 (two) times daily. 12/27/15 01/13/16  Barbaraann Barthel, NP  hydrOXYzine (ATARAX/VISTARIL) 10 MG tablet Take 1 tablet (10 mg total) by mouth at bedtime as needed  for anxiety. 12/13/15   Doreene Nest, NP  ibuprofen (ADVIL,MOTRIN) 800 MG tablet Take 1 tablet (800 mg total) by  mouth every 8 (eight) hours as needed. 11/28/15   Ignacia Bayley, PA-C  meclizine (ANTIVERT) 25 MG tablet Take 1 tablet (25 mg total) by mouth 4 (four) times daily as needed for dizziness. 12/27/15 01/13/16  Barbaraann Barthel, NP  sodium chloride (OCEAN) 0.65 % SOLN nasal spray Place 2 sprays into both nostrils every 2 (two) hours while awake. 12/27/15 01/13/16  Barbaraann Barthel, NP   Meds Ordered and Administered this Visit  Medications - No data to display  BP 133/75 mmHg  Pulse 114  Temp(Src) 98.9 F (37.2 C) (Oral)  Resp 18  Ht 5\' 1"  (1.549 m)  Wt 160 lb (72.576 kg)  BMI 30.25 kg/m2  SpO2 100%  LMP 12/19/2015 (Exact Date) No data found.   Physical Exam  Constitutional: She is oriented to person, place, and time. Vital signs are normal. She appears well-developed and well-nourished. She is active and cooperative.  Non-toxic appearance. She does not have a sickly appearance. She does not appear ill. No distress.  HENT:  Head: Normocephalic and atraumatic.  Right Ear: Hearing, external ear and ear canal normal. A middle ear effusion is present.  Left Ear: Hearing, external ear and ear canal normal. A middle ear effusion is present.  Nose: Mucosal edema and rhinorrhea present. No nose lacerations, sinus tenderness, nasal deformity, septal deviation or nasal septal hematoma. No epistaxis.  No foreign bodies. Right sinus exhibits no maxillary sinus tenderness and no frontal sinus tenderness. Left sinus exhibits no maxillary sinus tenderness and no frontal sinus tenderness.  Mouth/Throat: Uvula is midline and mucous membranes are normal. Mucous membranes are not pale, not dry and not cyanotic. She does not have dentures. No oral lesions. No trismus in the jaw. Normal dentition. No dental abscesses, uvula swelling, lacerations or dental caries. Posterior oropharyngeal edema and posterior oropharyngeal erythema present. No oropharyngeal exudate or tonsillar abscesses.  Bilateral allergic shiners;  cobblestoning posterior pharynx; bilateral TMs air fluid level slight opacity; bilateral nasal turbinates edema/erythema yellow discharge  Eyes: Conjunctivae, EOM and lids are normal. Pupils are equal, round, and reactive to light. Right eye exhibits no chemosis, no discharge, no exudate and no hordeolum. No foreign body present in the right eye. Left eye exhibits no chemosis, no discharge, no exudate and no hordeolum. No foreign body present in the left eye. Right conjunctiva is not injected. Right conjunctiva has no hemorrhage. Left conjunctiva is not injected. Left conjunctiva has no hemorrhage. No scleral icterus. Right eye exhibits normal extraocular motion and no nystagmus. Left eye exhibits normal extraocular motion and no nystagmus. Right pupil is round and reactive. Left pupil is round and reactive. Pupils are equal.  Neck: Trachea normal and normal range of motion. Neck supple. No tracheal tenderness, no spinous process tenderness and no muscular tenderness present. No rigidity. No tracheal deviation, no edema, no erythema and normal range of motion present. No thyroid mass and no thyromegaly present.  Cardiovascular: Normal rate, regular rhythm, S1 normal, S2 normal, normal heart sounds and intact distal pulses.  PMI is not displaced.  Exam reveals no gallop and no friction rub.   No murmur heard. Pulmonary/Chest: Effort normal and breath sounds normal. No accessory muscle usage or stridor. No respiratory distress. She has no decreased breath sounds. She has no wheezes. She has no rhonchi. She has no rales. She exhibits no tenderness.  Abdominal: Soft. She  exhibits no distension.  Musculoskeletal: She exhibits no edema.       Right shoulder: Normal.       Left shoulder: Normal.       Right hip: Normal.       Left hip: Normal.       Right knee: Normal.       Left knee: Normal.       Cervical back: She exhibits decreased range of motion, tenderness, pain and spasm. She exhibits no bony  tenderness, no swelling, no edema, no deformity, no laceration and normal pulse.       Right hand: Normal.       Left hand: Normal.  Bilateral trapezius tight; c1-c7 paraspinals not TTP; full c/t/l/spine arom normal heel toe on/off exam table without difficulty  Lymphadenopathy:       Head (right side): No submental, no submandibular, no tonsillar, no preauricular, no posterior auricular and no occipital adenopathy present.       Head (left side): No submental, no submandibular, no tonsillar, no preauricular, no posterior auricular and no occipital adenopathy present.    She has no cervical adenopathy.       Right cervical: No superficial cervical, no deep cervical and no posterior cervical adenopathy present.      Left cervical: No superficial cervical, no deep cervical and no posterior cervical adenopathy present.  Neurological: She is alert and oriented to person, place, and time. She has normal strength. She is not disoriented. She displays no atrophy and no tremor. No cranial nerve deficit or sensory deficit. She exhibits normal muscle tone. She displays no seizure activity. Coordination and gait normal. GCS eye subscore is 4. GCS verbal subscore is 5. GCS motor subscore is 6.  Skin: Skin is warm, dry and intact. No abrasion, no bruising, no burn, no ecchymosis, no laceration, no lesion, no petechiae and no rash noted. She is not diaphoretic. No cyanosis or erythema. No pallor. Nails show no clubbing.  Psychiatric: She has a normal mood and affect. Her speech is normal and behavior is normal. Judgment and thought content normal. Cognition and memory are normal.  Nursing note and vitals reviewed.   ED Course  Procedures (including critical care time)  Labs Review Labs Reviewed - No data to display  Imaging Review No results found.    MDM   1. Seasonal allergies   2. Eustachian tube dysfunction, bilateral   3. Neck strain, subsequent encounter    Patient may use normal saline  nasal spray as needed-discussed aggressive use q2hrs if post nasal drip/congestion while awake.  Consider antihistamine has used meclizine in the past for motion sickness avoid alcohol and driving until known if drowsy after taking it.   po QID prn vertigo max  per 24 hours.  If still post nasal drip with meclizine and saline start flonase 1 spray each nostril BID.  Avoid triggers if possible.  Shower prior to bedtime if exposed to triggers.  If allergic dust/dust mites recommend mattress/pillow covers/encasements; washing linens, vacuuming, sweeping, dusting weekly.  Call or return to clinic as needed if these symptoms worsen or fail to improve as anticipated.   Exitcare handout on allergic rhinitis given to patient.  Patient verbalized understanding of instructions, agreed with plan of care and had no further questions at this time.  P2:  Avoidance and hand washing.  Discussed with patient otitis media with effusion probably causing vertigo but could also be age.    Supportive treatment may take up to 4  doses meclizine per day max 100mg  per 24 hours. Follow up if aphasia, dysphasia, visual changes, weakness, fall, worst headache of life, incoordination, fever, ear discharge.  Consider ENT evaluation/follow up with PCM if worsening symptoms not controlled with meclizine or needs Rx refill.  Patient verbalized understanding of information/agreed with plan of care and had no further questions at this time.  Supportive treatment.   No evidence of invasive bacterial infection, non toxic and well hydrated.  This is most likely self limiting viral infection.  I do not see where any further testing or imaging is necessary at this time.   I will suggest supportive care, rest, good hygiene and encourage the patient to take adequate fluids.  The patient is to return to clinic or EMERGENCY ROOM if symptoms worsen or change significantly e.g. ear pain, fever, purulent discharge from ears or bleeding.  Exitcare  handout on otitis media with effusion given to patient.  Patient verbalized agreement and understanding of treatment plan.    Tylenol 1000mg  po QID prn pain.  Cryo/heat therapy 15 minutes QID prn spasms.  Home stretches demonstrated to patient-e.g. neck circles, fingers walking up wall, chest stretches, neck AROM, chin tucks, knee to chest and rock side to side on back.  Consider physical therapy referral if no improvement with prescribed therapy.  Ensure ergonomics correct desk at work avoid repetitive motions if possible/holding phone/laptop in hand use desk/stand and riase monitor height so not having to look down. Stretch/get up hourly.  Ensuring using her telephone headset instead of cradling phone to ear.    Patient was instructed to rest, ice, and ROM exercises.  Activity as tolerated.   Follow up if symptoms persist or worsen.  Exitcare handout on neck strain given to patient.Patient verbalized agreement and understanding of treatment plan.     P2:  Injury Prevention and Fitness.  Floaters normal part of aging but some chronic diseases could worsen floaters e.g. Diabetes, hypertension; if visual field loss immediate follow up same day for eye exam ER/optometry.  Keep PCM follow up appt for diabetes testing as scheduled.  Wear reading glasses avoid eye strain.  Hourly look away from computer screen/rest eyes 5 minutes Discussed glitterly sparkly floaters could be related to blood vessel spasms/eye strain and/or occular migraine and [facility] reoccurs despite optimzing workspace, wearing reading glasses and hourly rest breaks/stretching x 5 minutes.  Patient verbalized agreement and understanding of treatment plan and no further questions at this time.          Barbaraann Barthelina A Agam Tuohy, NP 12/27/15 1621

## 2015-12-27 NOTE — Discharge Instructions (Signed)
Allergic Rhinitis Allergic rhinitis is when the mucous membranes in the nose respond to allergens. Allergens are particles in the air that cause your body to have an allergic reaction. This causes you to release allergic antibodies. Through a chain of events, these eventually cause you to release histamine into the blood stream. Although meant to protect the body, it is this release of histamine that causes your discomfort, such as frequent sneezing, congestion, and an itchy, runny nose.  CAUSES Seasonal allergic rhinitis (hay fever) is caused by pollen allergens that may come from grasses, trees, and weeds. Year-round allergic rhinitis (perennial allergic rhinitis) is caused by allergens such as house dust mites, pet dander, and mold spores. SYMPTOMS  Nasal stuffiness (congestion).  Itchy, runny nose with sneezing and tearing of the eyes. DIAGNOSIS Your health care provider can help you determine the allergen or allergens that trigger your symptoms. If you and your health care provider are unable to determine the allergen, skin or blood testing may be used. Your health care provider will diagnose your condition after taking your health history and performing a physical exam. Your health care provider may assess you for other related conditions, such as asthma, pink eye, or an ear infection. TREATMENT Allergic rhinitis does not have a cure, but it can be controlled by:  Medicines that block allergy symptoms. These may include allergy shots, nasal sprays, and oral antihistamines.  Avoiding the allergen. Hay fever may often be treated with antihistamines in pill or nasal spray forms. Antihistamines block the effects of histamine. There are over-the-counter medicines that may help with nasal congestion and swelling around the eyes. Check with your health care provider before taking or giving this medicine. If avoiding the allergen or the medicine prescribed do not work, there are many new medicines  your health care provider can prescribe. Stronger medicine may be used if initial measures are ineffective. Desensitizing injections can be used if medicine and avoidance does not work. Desensitization is when a patient is given ongoing shots until the body becomes less sensitive to the allergen. Make sure you follow up with your health care provider if problems continue. HOME CARE INSTRUCTIONS It is not possible to completely avoid allergens, but you can reduce your symptoms by taking steps to limit your exposure to them. It helps to know exactly what you are allergic to so that you can avoid your specific triggers. SEEK MEDICAL CARE IF:  You have a fever.  You develop a cough that does not stop easily (persistent). You have shortness of breath.Benign Positional Vertigo Vertigo is the feeling that you or your surroundings are moving when they are not. Benign positional vertigo is the most common form of vertigo. The cause of this condition is not serious (is benign). This condition is triggered by certain movements and positions (is positional). This condition can be dangerous if it occurs while you are doing something that could endanger you or others, such as driving.  CAUSES In many cases, the cause of this condition is not known. It may be caused by a disturbance in an area of the inner ear that helps your brain to sense movement and balance. This disturbance can be caused by a viral infection (labyrinthitis), head injury, or repetitive motion. RISK FACTORS This condition is more likely to develop in: Women. People who are 11 years of age or older. SYMPTOMS Symptoms of this condition usually happen when you move your head or your eyes in different directions. Symptoms may start suddenly, and  they usually last for less than a minute. Symptoms may include: Loss of balance and falling. Feeling like you are spinning or moving. Feeling like your surroundings are spinning or moving. Nausea and  vomiting. Blurred vision. Dizziness. Involuntary eye movement (nystagmus). Symptoms can be mild and cause only slight annoyance, or they can be severe and interfere with daily life. Episodes of benign positional vertigo may return (recur) over time, and they may be triggered by certain movements. Symptoms may improve over time. DIAGNOSIS This condition is usually diagnosed by medical history and a physical exam of the head, neck, and ears. You may be referred to a health care provider who specializes in ear, nose, and throat (ENT) problems (otolaryngologist) or a provider who specializes in disorders of the nervous system (neurologist). You may have additional testing, including: MRI. A CT scan. Eye movement tests. Your health care provider may ask you to change positions quickly while he or she watches you for symptoms of benign positional vertigo, such as nystagmus. Eye movement may be tested with an electronystagmogram (ENG), caloric stimulation, the Dix-Hallpike test, or the roll test. An electroencephalogram (EEG). This records electrical activity in your brain. Hearing tests. TREATMENT Usually, your health care provider will treat this by moving your head in specific positions to adjust your inner ear back to normal. Surgery may be needed in severe cases, but this is rare. In some cases, benign positional vertigo may resolve on its own in 2-4 weeks. HOME CARE INSTRUCTIONS Safety Move slowly.Avoid sudden body or head movements. Avoid driving. Avoid operating heavy machinery. Avoid doing any tasks that would be dangerous to you or others if a vertigo episode would occur. If you have trouble walking or keeping your balance, try using a cane for stability. If you feel dizzy or unstable, sit down right away. Return to your normal activities as told by your health care provider. Ask your health care provider what activities are safe for you. General Instructions Take over-the-counter and  prescription medicines only as told by your health care provider. Avoid certain positions or movements as told by your health care provider. Drink enough fluid to keep your urine clear or pale yellow. Keep all follow-up visits as told by your health care provider. This is important. SEEK MEDICAL CARE IF: You have a fever. Your condition gets worse or you develop new symptoms. Your family or friends notice any behavioral changes. Your nausea or vomiting gets worse. You have numbness or a "pins and needles" sensation. SEEK IMMEDIATE MEDICAL CARE IF: You have difficulty speaking or moving. You are always dizzy. You faint. You develop severe headaches. You have weakness in your legs or arms. You have changes in your hearing or vision. You develop a stiff neck. You develop sensitivity to light.   This information is not intended to replace advice given to you by your health care provider. Make sure you discuss any questions you have with your health care provider.   Document Released: 03/09/2006 Document Revised: 02/20/2015 Document Reviewed: 09/24/2014 Elsevier Interactive Patient Education 2016 [employer].    You start wheezing.  Symptoms interfere with normal daily activities.   This information is not intended to replace advice given to you by your health care provider. Make sure you discuss any questions you have with your health care provider.   Document Released: 02/24/2001 Document Revised: 06/22/2014 Document Reviewed: 02/06/2013 Elsevier Interactive Patient Education 2016 [employer] Inc[employer] Tension Headache A tension headache is a feeling of pain, pressure, or aching that  is often felt over the front and sides of the head. The pain can be dull, or it can feel tight (constricting). Tension headaches are not normally associated with nausea or vomiting, and they do not get worse with physical activity. Tension headaches can last from 30 minutes to several days. This is the most  common type of headache. CAUSES The exact cause of this condition is not known. Tension headaches often begin after stress, anxiety, or depression. Other triggers may include:  Alcohol.  Too much caffeine, or caffeine withdrawal.  Respiratory infections, such as colds, flu, or sinus infections.  Dental problems or teeth clenching.  Fatigue.  Holding your head and neck in the same position for a long period of time, such as while using a computer.  Smoking. SYMPTOMS Symptoms of this condition include:  A feeling of pressure around the head.  Dull, aching head pain.  Pain felt over the front and sides of the head.  Tenderness in the muscles of the head, neck, and shoulders. DIAGNOSIS This condition may be diagnosed based on your symptoms and a physical exam. Tests may be done, such as a CT scan or an MRI of your head. These tests may be done if your symptoms are severe or unusual. TREATMENT This condition may be treated with lifestyle changes and medicines to help relieve symptoms. HOME CARE INSTRUCTIONS Managing Pain  Take over-the-counter and prescription medicines only as told by your health care provider.  Lie down in a dark, quiet room when you have a headache.  If directed, apply ice to the head and neck area:  Put ice in a plastic bag.  Place a towel between your skin and the bag.  Leave the ice on for 20 minutes, 2-3 times per day.  Use a heating pad or a hot shower to apply heat to the head and neck area as told by your health care provider. Eating and Drinking  Eat meals on a regular schedule.  Limit alcohol use.  Decrease your caffeine intake, or stop using caffeine. General Instructions  Keep all follow-up visits as told by your health care provider. This is important.  Keep a headache journal to help find out what may trigger your headaches. For example, write down:  What you eat and drink.  How much sleep you get.  Any change to your diet or  medicines.  Try massage or other relaxation techniques.  Limit stress.  Sit up straight, and avoid tensing your muscles.  Do not use tobacco products, including cigarettes, chewing tobacco, or e-cigarettes. If you need help quitting, ask your health care provider.  Exercise regularly as told by your health care provider.  Get 7-9 hours of sleep, or the amount recommended by your health care provider. SEEK MEDICAL CARE IF:  Your symptoms are not helped by medicine.  You have a headache that is different from what you normally experience.  You have nausea or you vomit.  You have a fever. SEEK IMMEDIATE MEDICAL CARE IF:  Your headache becomes severe.  You have repeated vomiting.  You have a stiff neck.  You have a loss of vision.  You have problems with speech.  You have pain in your eye or ear.  You have muscular weakness or loss of muscle control.  You lose your balance or you have trouble walking.  You feel faint or you pass out.  You have confusion.   This information is not intended to replace advice given to you by your  health care provider. Make sure you discuss any questions you have with your health care provider.   Document Released: 06/01/2005 Document Revised: 02/20/2015 Document Reviewed: 09/24/2014 Elsevier Interactive Patient Education 2016 Elsevier Inc. Sinusitis, Adult Sinusitis is redness, soreness, and inflammation of the paranasal sinuses. Paranasal sinuses are air pockets within the bones of your face. They are located beneath your eyes, in the middle of your forehead, and above your eyes. In healthy paranasal sinuses, mucus is able to drain out, and air is able to circulate through them by way of your nose. However, when your paranasal sinuses are inflamed, mucus and air can become trapped. This can allow bacteria and other germs to grow and cause infection. Sinusitis can develop quickly and last only a short time (acute) or continue over a long  period (chronic). Sinusitis that lasts for more than 12 weeks is considered chronic. CAUSES Causes of sinusitis include:  Allergies.  Structural abnormalities, such as displacement of the cartilage that separates your nostrils (deviated septum), which can decrease the air flow through your nose and sinuses and affect sinus drainage.  Functional abnormalities, such as when the small hairs (cilia) that line your sinuses and help remove mucus do not work properly or are not present. SIGNS AND SYMPTOMS Symptoms of acute and chronic sinusitis are the same. The primary symptoms are pain and pressure around the affected sinuses. Other symptoms include:  Upper toothache.  Earache.  Headache.  Bad breath.  Decreased sense of smell and taste.  A cough, which worsens when you are lying flat.  Fatigue.  Fever.  Thick drainage from your nose, which often is green and may contain pus (purulent).  Swelling and warmth over the affected sinuses. DIAGNOSIS Your health care provider will perform a physical exam. During your exam, your health care provider may perform any of the following to help determine if you have acute sinusitis or chronic sinusitis:  Look in your nose for signs of abnormal growths in your nostrils (nasal polyps).  Tap over the affected sinus to check for signs of infection.  View the inside of your sinuses using an imaging device that has a light attached (endoscope). If your health care provider suspects that you have chronic sinusitis, one or more of the following tests may be recommended:  Allergy tests.  Nasal culture. A sample of mucus is taken from your nose, sent to a lab, and screened for bacteria.  Nasal cytology. A sample of mucus is taken from your nose and examined by your health care provider to determine if your sinusitis is related to an allergy. TREATMENT Most cases of acute sinusitis are related to a viral infection and will resolve on their own  within 10 days. Sometimes, medicines are prescribed to help relieve symptoms of both acute and chronic sinusitis. These may include pain medicines, decongestants, nasal steroid sprays, or saline sprays. However, for sinusitis related to a bacterial infection, your health care provider will prescribe antibiotic medicines. These are medicines that will help kill the bacteria causing the infection. Rarely, sinusitis is caused by a fungal infection. In these cases, your health care provider will prescribe antifungal medicine. For some cases of chronic sinusitis, surgery is needed. Generally, these are cases in which sinusitis recurs more than 3 times per year, despite other treatments. HOME CARE INSTRUCTIONS  Drink plenty of water. Water helps thin the mucus so your sinuses can drain more easily.  Use a humidifier.  Inhale steam 3-4 times a day (for example, sit  in the bathroom with the shower running).  Apply a warm, moist washcloth to your face 3-4 times a day, or as directed by your health care provider.  Use saline nasal sprays to help moisten and clean your sinuses.  Take medicines only as directed by your health care provider.  If you were prescribed either an antibiotic or antifungal medicine, finish it all even if you start to feel better. SEEK IMMEDIATE MEDICAL CARE IF:  You have increasing pain or severe headaches.  You have nausea, vomiting, or drowsiness.  You have swelling around your face.  You have vision problems.  You have a stiff neck.  You have difficulty breathing.   This information is not intended to replace advice given to you by your health care provider. Make sure you discuss any questions you have with your health care provider.   Document Released: 06/01/2005 Document Revised: 06/22/2014 Document Reviewed: 06/16/2011 Elsevier Interactive Patient Education 2016 Elsevier Inc. Otitis Media With Effusion Otitis media with effusion is the presence of fluid in  the middle ear. This is a common problem in children, which often follows ear infections. It may be present for weeks or longer after the infection. Unlike an acute ear infection, otitis media with effusion refers only to fluid behind the ear drum and not infection. Children with repeated ear and sinus infections and allergy problems are the most likely to get otitis media with effusion. CAUSES  The most frequent cause of the fluid buildup is dysfunction of the eustachian tubes. These are the tubes that drain fluid in the ears to the back of the nose (nasopharynx). SYMPTOMS   The main symptom of this condition is hearing loss. As a result, you or your child may:  Listen to the TV at a loud volume.  Not respond to questions.  Ask "what" often when spoken to.  Mistake or confuse one sound or word for another.  There may be a sensation of fullness or pressure but usually not pain. DIAGNOSIS   Your health care provider will diagnose this condition by examining you or your child's ears.  Your health care provider may test the pressure in you or your child's ear with a tympanometer.  A hearing test may be conducted if the problem persists. TREATMENT   Treatment depends on the duration and the effects of the effusion.  Antibiotics, decongestants, nose drops, and cortisone-type drugs (tablets or nasal spray) may not be helpful.  Children with persistent ear effusions may have delayed language or behavioral problems. Children at risk for developmental delays in hearing, learning, and speech may require referral to a specialist earlier than children not at risk.  You or your child's health care provider may suggest a referral to an ear, nose, and throat surgeon for treatment. The following may help restore normal hearing:  Drainage of fluid.  Placement of ear tubes (tympanostomy tubes).  Removal of adenoids (adenoidectomy). HOME CARE INSTRUCTIONS   Avoid secondhand smoke.  Infants who  are breastfed are less likely to have this condition.  Avoid feeding infants while they are lying flat.  Avoid known environmental allergens.  Avoid people who are sick. SEEK MEDICAL CARE IF:   Hearing is not better in 3 months.  Hearing is worse.  Ear pain.  Drainage from the ear.  Dizziness. MAKE SURE YOU:   Understand these instructions.  Will watch your condition.  Will get help right away if you are not doing well or get worse.   This information  is not intended to replace advice given to you by your health care provider. Make sure you discuss any questions you have with your health care provider.   Document Released: 07/09/2004 Document Revised: 06/22/2014 Document Reviewed: 12/27/2012 Elsevier Interactive Patient Education 2016 Else[employer] Inc[employer] Cervical Sprain A cervical sprain is an injury in the neck in which the strong, fibrous tissues (ligaments) that connect your neck bones stretch or tear. Cervical sprains can range from mild to severe. Severe cervical sprains can cause the neck vertebrae to be unstable. This can lead to damage of the spinal cord and can result in serious nervous system problems. The amount of time it takes for a cervical sprain to get better depends on the cause and extent of the injury. Most cervical sprains heal in 1 to 3 weeks. CAUSES  Severe cervical sprains may be caused by:   Contact sport injuries (such as from football, rugby, wrestling, hockey, auto racing, gymnastics, diving, martial arts, or boxing).   Motor vehicle collisions.   Whiplash injuries. This is an injury from a sudden forward and backward whipping movement of the head and neck.  Falls.  Mild cervical sprains may be caused by:   Being in an awkward position, such as while cradling a telephone between your ear and shoulder.   Sitting in a chair that does not offer proper support.   Working at a poorly Marketing executive station.   Looking up or down for long  periods of time.  SYMPTOMS   Pain, soreness, stiffness, or a burning sensation in the front, back, or sides of the neck. This discomfort may develop immediately after the injury or slowly, 24 hours or more after the injury.   Pain or tenderness directly in the middle of the back of the neck.   Shoulder or upper back pain.   Limited ability to move the neck.   Headache.   Dizziness.   Weakness, numbness, or tingling in the hands or arms.   Muscle spasms.   Difficulty swallowing or chewing.   Tenderness and swelling of the neck.  DIAGNOSIS  Most of the time your health care provider can diagnose a cervical sprain by taking your history and doing a physical exam. Your health care provider will ask about previous neck injuries and any known neck problems, such as arthritis in the neck. X-rays may be taken to find out if there are any other problems, such as with the bones of the neck. Other tests, such as a CT scan or MRI, may also be needed.  TREATMENT  Treatment depends on the severity of the cervical sprain. Mild sprains can be treated with rest, keeping the neck in place (immobilization), and pain medicines. Severe cervical sprains are immediately immobilized. Further treatment is done to help with pain, muscle spasms, and other symptoms and may include:  Medicines, such as pain relievers, numbing medicines, or muscle relaxants.   Physical therapy. This may involve stretching exercises, strengthening exercises, and posture training. Exercises and improved posture can help stabilize the neck, strengthen muscles, and help stop symptoms from returning.  HOME CARE INSTRUCTIONS   Put ice on the injured area.   Put ice in a plastic bag.   Place a towel between your skin and the bag.   Leave the ice on for 15-20 minutes, 3-4 times a day.   If your injury was severe, you may have been given a cervical collar to wear. A cervical collar is a two-piece collar designed to  keep  your neck from moving while it heals.  Do not remove the collar unless instructed by your health care provider.  If you have long hair, keep it outside of the collar.  Ask your health care provider before making any adjustments to your collar. Minor adjustments may be required over time to improve comfort and reduce pressure on your chin or on the back of your head.  Ifyou are allowed to remove the collar for cleaning or bathing, follow your health care provider's instructions on how to do so safely.  Keep your collar clean by wiping it with mild soap and water and drying it completely. If the collar you have been given includes removable pads, remove them every 1-2 days and hand wash them with soap and water. Allow them to air dry. They should be completely dry before you wear them in the collar.  If you are allowed to remove the collar for cleaning and bathing, wash and dry the skin of your neck. Check your skin for irritation or sores. If you see any, tell your health care provider.  Do not drive while wearing the collar.   Only take over-the-counter or prescription medicines for pain, discomfort, or fever as directed by your health care provider.   Keep all follow-up appointments as directed by your health care provider.   Keep all physical therapy appointments as directed by your health care provider.   Make any needed adjustments to your workstation to promote good posture.   Avoid positions and activities that make your symptoms worse.   Warm up and stretch before being active to help prevent problems.  SEEK MEDICAL CARE IF:   Your pain is not controlled with medicine.   You are unable to decrease your pain medicine over time as planned.   Your activity level is not improving as expected.  SEEK IMMEDIATE MEDICAL CARE IF:   You develop any bleeding.  You develop stomach upset.  You have signs of an allergic reaction to your medicine.   Your symptoms  get worse.   You develop new, unexplained symptoms.   You have numbness, tingling, weakness, or paralysis in any part of your body.  MAKE SURE YOU:   Understand these instructions.  Will watch your condition.  Will get help right away if you are not doing well or get worse.   This information is not intended to replace advice given to you by your health care provider. Make sure you discuss any questions you have with your health care provider.   Document Released: 03/29/2007 Document Revised: 06/06/2013 Document Reviewed: 12/07/2012 Elsevier Interactive Patient Education 2016 [employer] Warner[employer]otitis [employer] Barotitis media is inflammation of your middle ear. This occurs when the auditory tube (eustachian tube) leading from the back of your nose (nasopharynx) to your eardrum is blocked. This blockage may result from a cold, environmental allergies, or an upper respiratory infection. Unresolved barotitis media may lead to damage or hearing loss (barotrauma), which may become permanent. HOME CARE INSTRUCTIONS   Use medicines as recommended by your health care provider. Over-the-counter medicines will help unblock the canal and can help during times of air travel.  Do not put anything into your ears to clean or unplug them. Eardrops will not be helpful.  Do not swim, dive, or fly until your health care provider says it is all right to do so. If these activities are necessary, chewing gum with frequent, forceful swallowing may help. It is also helpful to hold your nose and  gently blow to pop your ears for equalizing pressure changes. This forces air into the eustachian tube.  Only take over-the-counter or prescription medicines for pain, discomfort, or fever as directed by your health care provider.  A decongestant may be helpful in decongesting the middle ear and make pressure equalization easier. SEEK MEDICAL CARE IF:  You experience a serious form of dizziness in which you feel as if  the room is spinning and you feel nauseated (vertigo).  Your symptoms only involve one ear. SEEK IMMEDIATE MEDICAL CARE IF:   You develop a severe headache, dizziness, or severe ear pain.  You have bloody or pus-like drainage from your ears.  You develop a fever.  Your problems do not improve or become worse. MAKE SURE YOU:   Understand these instructions.  Will watch your condition.  Will get help right away if you are not doing well or get worse.   This information is not intended to replace advice given to you by your health care provider. Make sure you discuss any questions you have with your health care provider.   Document Released: 05/29/2000 Document Revised: 03/22/2013 Document Reviewed: 12/27/2012 Elsevier Interactive Patient Education [employer]

## 2015-12-27 NOTE — ED Notes (Signed)
Patient states she has had a head for about a month, feels a little dizzy and has lots of floaters

## 2015-12-30 ENCOUNTER — Ambulatory Visit (INDEPENDENT_AMBULATORY_CARE_PROVIDER_SITE_OTHER): Payer: 59 | Admitting: Primary Care

## 2015-12-30 ENCOUNTER — Encounter: Payer: Self-pay | Admitting: Primary Care

## 2015-12-30 VITALS — BP 122/72 | HR 93 | Temp 98.3°F | Ht 61.5 in | Wt 161.1 lb

## 2015-12-30 DIAGNOSIS — R42 Dizziness and giddiness: Secondary | ICD-10-CM | POA: Diagnosis not present

## 2015-12-30 DIAGNOSIS — R519 Headache, unspecified: Secondary | ICD-10-CM

## 2015-12-30 DIAGNOSIS — R51 Headache: Secondary | ICD-10-CM

## 2015-12-30 LAB — TSH: TSH: 1.77 u[IU]/mL (ref 0.35–4.50)

## 2015-12-30 LAB — COMPREHENSIVE METABOLIC PANEL
ALBUMIN: 4.4 g/dL (ref 3.5–5.2)
ALK PHOS: 72 U/L (ref 39–117)
ALT: 32 U/L (ref 0–35)
AST: 24 U/L (ref 0–37)
BUN: 14 mg/dL (ref 6–23)
CO2: 29 mEq/L (ref 19–32)
CREATININE: 0.77 mg/dL (ref 0.40–1.20)
Calcium: 10 mg/dL (ref 8.4–10.5)
Chloride: 103 mEq/L (ref 96–112)
GFR: 96.71 mL/min (ref >60.00)
GLUCOSE: 108 mg/dL — AB (ref 70–99)
Potassium: 3.9 mEq/L (ref 3.5–5.1)
SODIUM: 137 meq/L (ref 135–145)
TOTAL PROTEIN: 7.2 g/dL (ref 6.0–8.3)
Total Bilirubin: 0.3 mg/dL (ref 0.2–1.2)

## 2015-12-30 LAB — HEMOGLOBIN A1C: HEMOGLOBIN A1C: 5.5 % (ref 4.6–6.5)

## 2015-12-30 NOTE — Progress Notes (Signed)
Pre visit review using our clinic review tool, if applicable. No additional management support is needed unless otherwise documented below in the visit note. 

## 2015-12-30 NOTE — Progress Notes (Signed)
Subjective:    Patient ID: Rachel Mann, female    DOB: 1991-01-06, 25 y.o.   MRN: [id number]  HPI  [name] is a 25 year old female who presents today with a chief complaint of dizziness and headache.  She presented to the Urgent Care on 07/14 with a chief complaint of headache and dizziness. Her headache was located to the frontal lobes with eye floaters. She was evaluated by her optometrist in March 2017 and was provided with a new glasses prescription to use while at work. 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   She was diagnosed with acute otitis media with left ear effusion during her urgent care visit which was presumed to be causing vertigo. She was provided with a prescription for Meclizine and Augmentin and was told to follow up with PCP if no improvement.  Since her Urgent Care visit she's not experienced improvement in dizziness, headaches, and has since developed shakiness to bilateral hands. She continues to notice discomfort to her left ear that has slightly improved. She had some improvement in dizziness with Meclizine.   Her headaches have been present daily to her temporal and frontal lobes. She's taken tylenol and ibuprofen daily with temporary improvement. She will experience occasional photophobia and nausea with headaches. She describes her dizziness as feeling off balance.   Review of Systems  Constitutional: Negative for fever and chills.  HENT: Positive for ear pain.   Eyes: Positive for photophobia.  Endocrine: Negative for cold intolerance and polyuria.  Neurological: Positive for dizziness and headaches.       Past Medical History  Diagnosis Date  . Chickenpox   . Depression   . Kidney stone   . UTI (lower urinary tract infection)      Social History   Social History  . Marital Status: Single    Spouse Name: N/A  . Number of Children: N/A  . Years of Education: N/A   Occupational History  . Not on file.    Social History Main Topics  . Smoking status: Current Every Day Smoker -- 0.25 packs/day    Types: Cigarettes  . Smokeless tobacco: Not on file  . Alcohol Use: 0.0 oz/week    0 Standard drinks or equivalent per week     Comment: social  . Drug Use: No  . Sexual Activity: Not on file   Other Topics Concern  . Not on file   Social History Narrative   Divorced.   1 son.   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    Enjoys reading, crafts.     Past Surgical History  Procedure Laterality Date  . Kidney stone surgery  2012    Family History  Problem Relation Age of Onset  . Alcohol abuse Father   . Mental illness Maternal Grandmother   . Diabetes Maternal Grandmother     No Known Allergies  Current Outpatient Prescriptions on File Prior to Visit  Medication Sig Dispense Refill  . amoxicillin-clavulanate (AUGMENTIN) 875-125 MG tablet Take 1 tablet by mouth every 12 (twelve) hours. 14 tablet 0  . buPROPion (WELLBUTRIN XL) 150 MG 24 hr tablet Take 1 tablet (150 mg total) by mouth daily. 30 tablet 2  . fluticasone (FLONASE) 50 MCG/ACT nasal spray Place 1 spray into both nostrils 2 (two) times daily. 16 g 0  . hydrOXYzine (ATARAX/VISTARIL) 10 MG tablet Take 1 tablet (10 mg total) by mouth  at bedtime as needed for anxiety. 30 tablet 0  . ibuprofen (ADVIL,MOTRIN) 800 MG tablet Take 1 tablet (800 mg total) by mouth every 8 (eight) hours as needed. 15 tablet 0  . meclizine (ANTIVERT) 25 MG tablet Take 1 tablet (25 mg total) by mouth 4 (four) times daily as needed for dizziness. 30 tablet 0  . sodium chloride (OCEAN) 0.65 % SOLN nasal spray Place 2 sprays into both nostrils every 2 (two) hours while awake.  0   No current facility-administered medications on file prior to visit.    BP 122/72 mmHg  Pulse 93  Temp(Src) 98.3 F (36.8 C) (Oral)  Ht 5' 1.5" (1.562 m)  Wt 161 lb 1.9 oz (73.084 kg)  BMI 29.95 kg/m2  SpO2 97%  LMP 12/19/2015 (Exact  Date)    Objective:   Physical Exam  Constitutional: She is oriented to person, place, and time. She appears well-nourished.  Eyes: EOM are normal. Pupils are equal, round, and reactive to light.  Neck: Neck supple.  Cardiovascular: Normal rate and regular rhythm.   Pulmonary/Chest: Effort normal and breath sounds normal.  Neurological: She is alert and oriented to person, place, and time. No cranial nerve deficit.  Skin: Skin is warm and dry.          Assessment & Plan:  Urgent care follow-up:  Presented to urgent care last week with complaints of headache and dizziness. Slight improvement in dizziness with meclizine. Exam with mild effusion to left TM which I suspect is contributing to symptoms. Discussed use of daily antihistamine such as Claritin, Allegra, Zyrtec. Continue Flonase. Will check basic labs to ensure no electrolyte or thyroid imbalance. Labs pending. She is to notify me if no improvement.  Morrie Sheldon, NP

## 2015-12-30 NOTE — Assessment & Plan Note (Signed)
Present for about 1 month now and are daily. Located to frontal and temporal regions bilaterally. Neuro exam unremarkable. Could be anxiety related or related to staring at a computer daily for work. Will have her try Excedrin Migraine and if no improvement may need to consider preventative treatment for frequent headaches.

## 2015-12-30 NOTE — Patient Instructions (Signed)
Try Excedrin Migraine tablets for headaches.  Take a break from looking at the computer once every hour.  Continue antibiotics for now. Continue Meclizine as needed for dizziness.   Complete lab work prior to leaving today. I will notify you of your results once received.   Please e-mail me in 1 week if no improvement in headaches/dizziness.  It was a pleasure to see you today!

## 2016-01-09 ENCOUNTER — Encounter: Payer: Self-pay | Admitting: Primary Care

## 2016-01-10 ENCOUNTER — Other Ambulatory Visit: Payer: Self-pay | Admitting: Primary Care

## 2016-01-10 DIAGNOSIS — R51 Headache: Principal | ICD-10-CM

## 2016-01-10 DIAGNOSIS — R519 Headache, unspecified: Secondary | ICD-10-CM

## 2016-01-10 MED ORDER — PROPRANOLOL HCL ER 60 MG PO CP24: 60.0000 mg | ORAL_CAPSULE | Freq: Every day | ORAL | 1 refills | Status: DC

## 2016-01-24 ENCOUNTER — Encounter: Payer: Self-pay | Admitting: Primary Care

## 2016-01-26 ENCOUNTER — Telehealth: Payer: Self-pay | Admitting: Primary Care

## 2016-01-26 NOTE — Telephone Encounter (Signed)
-----   Message from Doreene NestKatherine K Crayton Savarese, NP sent at 01/10/2016  5:40 PM EDT ----- Regarding: headaches Please check on patient and ask her how her headaches are since starting propanolol.

## 2016-01-27 NOTE — Telephone Encounter (Signed)
Tried to call patient but could not leave message.

## 2016-01-29 NOTE — Telephone Encounter (Signed)
Spoken to patient and the headaches has improved. She hardly have any headaches since started propanolol.

## 2016-01-29 NOTE — Telephone Encounter (Signed)
Pt returned call please call back [phone]

## 2016-01-29 NOTE — Telephone Encounter (Signed)
Noted and glad to hear! 

## 2016-01-29 NOTE — Telephone Encounter (Signed)
Message left for patient to return my call.  

## 2016-02-07 ENCOUNTER — Encounter: Payer: Self-pay | Admitting: Primary Care

## 2016-02-07 ENCOUNTER — Ambulatory Visit (INDEPENDENT_AMBULATORY_CARE_PROVIDER_SITE_OTHER): Payer: 59 | Admitting: Primary Care

## 2016-02-07 VITALS — BP 122/76 | HR 87 | Temp 97.4°F | Ht 62.0 in | Wt 164.8 lb

## 2016-02-07 DIAGNOSIS — Z0000 Encounter for general adult medical examination without abnormal findings: Secondary | ICD-10-CM

## 2016-02-07 DIAGNOSIS — F32A Depression, unspecified: Secondary | ICD-10-CM

## 2016-02-07 DIAGNOSIS — F329 Major depressive disorder, single episode, unspecified: Secondary | ICD-10-CM

## 2016-02-07 DIAGNOSIS — F419 Anxiety disorder, unspecified: Secondary | ICD-10-CM

## 2016-02-07 DIAGNOSIS — F418 Other specified anxiety disorders: Secondary | ICD-10-CM

## 2016-02-07 DIAGNOSIS — R51 Headache: Secondary | ICD-10-CM | POA: Diagnosis not present

## 2016-02-07 DIAGNOSIS — R519 Headache, unspecified: Secondary | ICD-10-CM

## 2016-02-07 LAB — COMPREHENSIVE METABOLIC PANEL
ALK PHOS: 76 U/L (ref 39–117)
ALT: 33 U/L (ref 0–35)
AST: 20 U/L (ref 0–37)
Albumin: 4.2 g/dL (ref 3.5–5.2)
BILIRUBIN TOTAL: 0.3 mg/dL (ref 0.2–1.2)
BUN: 13 mg/dL (ref 6–23)
CALCIUM: 8.9 mg/dL (ref 8.4–10.5)
CO2: 28 mEq/L (ref 19–32)
CREATININE: 0.79 mg/dL (ref 0.40–1.20)
Chloride: 104 mEq/L (ref 96–112)
GFR: 93.81 mL/min (ref >60.00)
GLUCOSE: 94 mg/dL (ref 70–99)
Potassium: 4 mEq/L (ref 3.5–5.1)
Sodium: 137 mEq/L (ref 135–145)
TOTAL PROTEIN: 6.8 g/dL (ref 6.0–8.3)

## 2016-02-07 LAB — LIPID PANEL
CHOL/HDL RATIO: 5
Cholesterol: 160 mg/dL (ref 0–200)
HDL: 31.7 mg/dL — AB (ref >39.00)
LDL Cholesterol: 103 mg/dL — ABNORMAL HIGH (ref 0–99)
NONHDL: 127.93
Triglycerides: 125 mg/dL (ref 0.0–149.0)
VLDL: 25 mg/dL (ref 0.0–40.0)

## 2016-02-07 NOTE — Patient Instructions (Addendum)
Your symptoms sound similar to acid reflux which can cause nausea.  Try taking Zantac 150 mg once daily for the next several weeks to see if this helps reduce your symptoms.  Complete lab work prior to leaving today. I will notify you of your results once received.   Continue your efforts towards a healthy lifestyle with diet and exercise.  Please complete your Pap exam this year with either myself or GYN.  Please notify me if your headaches become worse.  Follow up in 1 year for repeat physical or sooner if needed.  It was a pleasure to see you today!

## 2016-02-07 NOTE — Progress Notes (Signed)
Subjective:    Patient ID: Mertie ClauseNicole May Yehle, female    DOB: 05-04-1991, 25 y.o.   MRN: [id number]  HPI  [name] is a 25 year old female who presents today for complete physical.  Immunizations: -Tetanus: Completed in July 2010 -Influenza: Did not completed last season  Diet: She endorses a fair diet Breakfast: Omelette  Lunch: Sandwich, wrap, chips Dinner: Chicken, potatoes, vegetable, occasional pasta Snacks: Oatmeal bars, banana chips, pretzels  Desserts: None Beverages: Water, coffee, green tea, occasional sweet tea  Exercise: She does not currently exercise Eye exam: Completed in March 2017 Dental exam: Completes semi-annually Pap Smear: Completed several years ago   Review of Systems  Constitutional: Negative for chills and fever.  HENT: Positive for congestion.   Eyes: Positive for itching.  Respiratory: Positive for cough. Negative for shortness of breath.   Cardiovascular: Negative for chest pain.  Gastrointestinal: Negative for constipation and diarrhea.       Some nausea before meals  Genitourinary:       IUD. Spotting for periods  Musculoskeletal: Negative for arthralgias and myalgias.  Allergic/Immunologic: Positive for environmental allergies.  Neurological: Negative for dizziness, numbness and headaches.  Psychiatric/Behavioral: Negative for suicidal ideas. The patient is not nervous/anxious.        Overall improvement in anxiety and depression.        Past Medical History:  Diagnosis Date  . Chickenpox   . Depression   . Kidney stone   . UTI (lower urinary tract infection)      Social History   Social History  . Marital status: Single    Spouse name: N/A  . Number of children: N/A  . Years of education: N/A   Occupational History  . Not on file.   Social History Main Topics  . Smoking status: Current Every Day Smoker    Packs/day: 0.25    Types: Cigarettes  . Smokeless tobacco: Not on file  . Alcohol use 0.0 oz/week   Comment: social  . Drug use: No  . Sexual activity: Not on file   Other Topics Concern  . Not on file   Social History Narrative   Divorced.   1 son.   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    Enjoys reading, crafts.     Past Surgical History:  Procedure Laterality Date  . KIDNEY STONE SURGERY  2012    Family History  Problem Relation Age of Onset  . Alcohol abuse Father   . Mental illness Maternal Grandmother   . Diabetes Maternal Grandmother     No Known Allergies  Current Outpatient Prescriptions on File Prior to Visit  Medication Sig Dispense Refill  . buPROPion (WELLBUTRIN XL) 150 MG 24 hr tablet Take 1 tablet (150 mg total) by mouth daily. (Patient not taking: Reported on 02/07/2016) 30 tablet 2  . hydrOXYzine (ATARAX/VISTARIL) 10 MG tablet Take 1 tablet (10 mg total) by mouth at bedtime as needed for anxiety. (Patient not taking: Reported on 02/07/2016) 30 tablet 0  . propranolol ER (INDERAL LA) 60 MG 24 hr capsule Take 1 capsule (60 mg total) by mouth daily. (Patient not taking: Reported on 02/07/2016) 30 capsule 1   No current facility-administered medications on file prior to visit.     BP 122/76   Pulse 87   Temp 97.4 F (36.3 C) (Oral)   Ht 5\' 2"  (1.575 m)   Wt 164 lb 12.8 oz (74.8 kg)   SpO2  98%   BMI 30.14 kg/m    Objective:   Physical Exam  Constitutional: She is oriented to person, place, and time. She appears well-nourished.  HENT:  Right Ear: Tympanic membrane and ear canal normal.  Left Ear: Tympanic membrane and ear canal normal.  Nose: Nose normal.  Mouth/Throat: Oropharynx is clear and moist.  Eyes: Conjunctivae and EOM are normal. Pupils are equal, round, and reactive to light.  Neck: Neck supple. No thyromegaly present.  Cardiovascular: Normal rate and regular rhythm.   No murmur heard. Pulmonary/Chest: Effort normal and breath sounds normal. She has no rales.  Abdominal: Soft. Bowel sounds are  normal. There is no tenderness.  Musculoskeletal: Normal range of motion.  Lymphadenopathy:    She has no cervical adenopathy.  Neurological: She is alert and oriented to person, place, and time. She has normal reflexes. No cranial nerve deficit.  Skin: Skin is warm and dry. No rash noted.  Psychiatric: She has a normal mood and affect.          Assessment & Plan:

## 2016-02-07 NOTE — Assessment & Plan Note (Signed)
Improved with Propranolol. Continue same. Neuro exam unremarkable.

## 2016-02-07 NOTE — Assessment & Plan Note (Signed)
Improved. Continue current regime.

## 2016-02-07 NOTE — Assessment & Plan Note (Signed)
Immunizations UTD. Pap due, declines today, patient to schedule. Commended her on healthy lifestyle changes. Encouraged her to continue. Exam unremarkable. Labs pending.  Follow up if headaches return. Follow up in 1 year for repeat physical.

## 2016-02-07 NOTE — Progress Notes (Signed)
Pre visit review using our clinic review tool, if applicable. No additional management support is needed unless otherwise documented below in the visit note. 

## 2016-03-04 ENCOUNTER — Ambulatory Visit
Admission: EM | Admit: 2016-03-04 | Discharge: 2016-03-04 | Disposition: A | Discharge status: Home / Self Care | Payer: 59 | Attending: Family Medicine | Admitting: Family Medicine

## 2016-03-04 ENCOUNTER — Encounter: Payer: Self-pay | Admitting: *Deleted

## 2016-03-04 DIAGNOSIS — J020 Streptococcal pharyngitis: Secondary | ICD-10-CM | POA: Diagnosis not present

## 2016-03-04 LAB — RAPID STREP SCREEN (MED CTR MEBANE ONLY): Streptococcus, Group A Screen (Direct): POSITIVE — AB

## 2016-03-04 MED ORDER — AMOXICILLIN 875 MG PO TABS: 875.0000 mg | ORAL_TABLET | Freq: Two times a day (BID) | ORAL | 0 refills | Status: DC

## 2016-03-04 MED ORDER — DEXAMETHASONE SODIUM PHOSPHATE 10 MG/ML IJ SOLN
10.0000 mg | Freq: Once | INTRAMUSCULAR | Status: AC
  Administered 2016-03-04: 10 mg via INTRAMUSCULAR

## 2016-03-04 NOTE — ED Triage Notes (Signed)
Patient started sneezing with nasal drainage yesterday that has developed into a severe sore throat. Tonsillar swelling is visible.

## 2016-03-04 NOTE — Discharge Instructions (Signed)
Take medication as prescribed. Rest. Drink plenty of fluids.  ° °Follow up with your primary care physician this week as needed. Return to Urgent care for new or worsening concerns.  ° °

## 2016-03-04 NOTE — ED Provider Notes (Signed)
[facility] ____________________________________________  Time seen: Approximately 5:55 PM  I have reviewed the triage vital signs and the nursing notes.   HISTORY  Chief Complaint Sore Throat; Nasal Congestion; and Otalgia   HPI Celines Femia Havel is a 25 y.o. female presents with a complaint of one day of sore throat. Patient states sore throat feels like swallowing razor blades. Patient reports mild runny nose and nasal congestion. Denies cough. Denies known fevers. Denies known sick contacts. Patient reports that she does have a kindergartner who just started school and has several sick classmates. Reports has continued to drink fluids well and eat some.  Denies headache, rash, abdominal pain, neck pain, back pain, dysuria, chest pain, shortness breath or other complaints. Denies recent sickness or recent antibiotic use.  No LMP recorded. Patient is not currently having periods (Reason: IUD). Denies concerns of pregnancy.   Past Medical History:  Diagnosis Date  . Chickenpox   . Depression   . Kidney stone   . UTI (lower urinary tract infection)     Patient Active Problem List   Diagnosis Date Noted  . Preventative health care 02/07/2016  . Frequent headaches 12/30/2015  . Seasonal allergies 12/13/2015  . Anxiety and depression 09/20/2015    Past Surgical History:  Procedure Laterality Date  . KIDNEY STONE SURGERY  2012    Current Outpatient Rx  . Order #: [id number] Class: Normal  . Order #: [id number] Class: Normal  . Order #: [id number] Class: Normal  . Order #: [id number] Class: Normal    No current facility-administered medications for this encounter.   Current Outpatient Prescriptions:  .  amoxicillin (AMOXIL) 875 MG tablet, Take 1 tablet (875 mg total) by mouth 2 (two) times daily., Disp: 20 tablet, Rfl: 0 .  buPROPion (WELLBUTRIN XL) 150 MG 24 hr tablet, Take 1 tablet (150 mg total) by mouth daily. (Patient not taking: Reported on 02/07/2016),  Disp: 30 tablet, Rfl: 2 .  hydrOXYzine (ATARAX/VISTARIL) 10 MG tablet, Take 1 tablet (10 mg total) by mouth at bedtime as needed for anxiety. (Patient not taking: Reported on 02/07/2016), Disp: 30 tablet, Rfl: 0 .  propranolol ER (INDERAL LA) 60 MG 24 hr capsule, Take 1 capsule (60 mg total) by mouth daily. (Patient not taking: Reported on 02/07/2016), Disp: 30 capsule, Rfl: 1  Allergies Review of patient's allergies indicates no known allergies.  Family History  Problem Relation Age of Onset  . Alcohol abuse Father   . Mental illness Maternal Grandmother   . Diabetes Maternal Grandmother     Social History Social History  Substance Use Topics  . Smoking status: Current Every Day Smoker    Packs/day: 0.25    Types: Cigarettes  . Smokeless tobacco: Never Used  . Alcohol use 0.0 oz/week     Comment: social    Review of Systems Constitutional: No fever/chills Eyes: No visual changes. ENT: Positive sore throat. Cardiovascular: Denies chest pain. Respiratory: Denies shortness of breath. Gastrointestinal: No abdominal pain.  No nausea, no vomiting.  No diarrhea.  No constipation. Genitourinary: Negative for dysuria. Musculoskeletal: Negative for back pain. Skin: Negative for rash. Neurological: Negative for headaches, focal weakness or numbness.  10-point ROS otherwise negative.  ____________________________________________   PHYSICAL EXAM:  VITAL SIGNS: ED Triage Vitals  Enc Vitals Group     BP 03/04/16 1731 122/79     Pulse Rate 03/04/16 1731 (!) 104 Recheck 86     Resp 03/04/16 1731 18     Temp 03/04/16 1731 98.4 F (36.9 C)  Temp Source 03/04/16 1731 Oral     SpO2 03/04/16 1731 100 %     Weight 03/04/16 1731 155 lb (70.3 kg)     Height 03/04/16 1731 5\' 1"  (1.549 m)     Head Circumference --      Peak Flow --      Pain Score 03/04/16 1735 7     Pain Loc --      Pain Edu? --      Excl. in GC? --     Constitutional: Alert and oriented. Well appearing and  in no acute distress. Eyes: Conjunctivae are normal. PERRL. EOMI. Head: Atraumatic. No sinus tenderness to palpation. No swelling. No erythema.  Ears: no erythema, normal TMs bilaterally.   Nose:No nasal congestion or rhinorrhea.  Mouth/Throat: Mucous membranes are moist. Moderate pharyngeal erythema with 3+ bilateral tonsillar swelling, no exudate. No uvular shift or deviation. Voice clear. No trismus. No drooling. Neck: No stridor.  No cervical spine tenderness to palpation. Hematological/Lymphatic/Immunilogical: Anterior cervical lymphadenopathy. Cardiovascular: Normal rate, regular rhythm. Grossly normal heart sounds.  Good peripheral circulation. Respiratory: Normal respiratory effort.  No retractions. Lungs CTAB.No wheezes, rales or rhonchi. Good air movement.  Gastrointestinal: Soft and nontender. Normal Bowel sounds. No CVA tenderness. No hepatosplenomegaly palpated. Musculoskeletal: Ambulatory steady gait. No cervical, thoracic or lumbar tenderness to palpation. Neurologic:  Normal speech and language. No gross focal neurologic deficits are appreciated. No gait instability. Skin:  Skin is warm, dry and intact. No rash noted. Psychiatric: Mood and affect are normal. Speech and behavior are normal. ___________________________________________   LABS (all labs ordered are listed, but only abnormal results are displayed)  Labs Reviewed  RAPID STREP SCREEN (NOT AT [facility]) - Abnormal; Notable for the following:       Result Value   Streptococcus, Group A Screen (Direct) POSITIVE (*)    All other components within normal limits     PROCEDURES Procedures    INITIAL IMPRESSION / ASSESSMENT AND PLAN / ED COURSE  Pertinent labs & imaging results that were available during my care of the patient were reviewed by me and considered in my medical decision making (see chart for details).  Well-appearing patient. No acute distress. Presents for one day history of sore throat. Moderate  pharyngeal erythema and tonsillar swelling. Quick strep positive. Discussed treatment options with patient. Patient opts for oral antibiotic treatment. Also discussed with patient decadron use, will administer 10 mg IM Decadron. Encourage rest, fluids and supportive care.Discussed indication, risks and benefits of medications with patient.  Discussed follow up with Primary care physician this week. Discussed follow up and return parameters including no resolution or any worsening concerns. Patient verbalized understanding and agreed to plan.   ____________________________________________   FINAL CLINICAL IMPRESSION(S) / ED DIAGNOSES  Final diagnoses:  Strep pharyngitis     Discharge Medication List as of 03/04/2016  6:05 PM    START taking these medications   Details  amoxicillin (AMOXIL) 875 MG tablet Take 1 tablet (875 mg total) by mouth 2 (two) times daily., Starting Wed 03/04/2016, Normal        Note: This dictation was prepared with Dragon dictation along with smaller phrase technology. Any transcriptional errors that result from this process are unintentional.    Clinical Course      Renford DillsLindsey Meeka Cartelli, NP 03/04/16 2027

## 2016-03-13 ENCOUNTER — Other Ambulatory Visit (HOSPITAL_COMMUNITY)
Admission: RE | Admit: 2016-03-13 | Discharge: 2016-03-13 | Disposition: A | Discharge status: Home / Self Care | Payer: 59 | Source: Ambulatory Visit | Attending: Primary Care | Admitting: Primary Care

## 2016-03-13 ENCOUNTER — Encounter: Payer: Self-pay | Admitting: Primary Care

## 2016-03-13 ENCOUNTER — Ambulatory Visit: Payer: Self-pay | Admitting: Primary Care

## 2016-03-13 ENCOUNTER — Ambulatory Visit (INDEPENDENT_AMBULATORY_CARE_PROVIDER_SITE_OTHER): Payer: 59 | Admitting: Primary Care

## 2016-03-13 VITALS — BP 118/72 | HR 78 | Temp 98.1°F | Ht 61.0 in | Wt 165.4 lb

## 2016-03-13 DIAGNOSIS — Z124 Encounter for screening for malignant neoplasm of cervix: Secondary | ICD-10-CM | POA: Diagnosis not present

## 2016-03-13 DIAGNOSIS — Z1151 Encounter for screening for human papillomavirus (HPV): Secondary | ICD-10-CM | POA: Diagnosis not present

## 2016-03-13 DIAGNOSIS — Z01419 Encounter for gynecological examination (general) (routine) without abnormal findings: Secondary | ICD-10-CM | POA: Diagnosis present

## 2016-03-13 NOTE — Progress Notes (Signed)
Subjective:    Patient ID: Rachel Mann, female    DOB: 03-17-1991, 25 y.o.   MRN: [id number]  HPI  Rachel Mann is a 25 year old female who presents today for her pap. She completed a physical exam on 08/25 and wished to delay her pap until a later date. She denies abdominal pain, vaginal discharge, vaginal itching, urinary symptoms. She does not do home self breast exams. She uses an IUD for birth control. She was recently treated for strep throat in the emergency department, and is feeling much improved.  Review of Systems  Constitutional: Negative for fever.  Gastrointestinal: Negative for abdominal pain.  Genitourinary: Negative for dysuria, frequency, hematuria, pelvic pain and vaginal discharge.       Past Medical History:  Diagnosis Date  . Chickenpox   . Depression   . Kidney stone   . UTI (lower urinary tract infection)      Social History   Social History  . Marital status: Single    Spouse name: N/A  . Number of children: N/A  . Years of education: N/A   Occupational History  . Not on file.   Social History Main Topics  . Smoking status: Current Every Day Smoker    Packs/day: 0.25    Types: Cigarettes  . Smokeless tobacco: Never Used  . Alcohol use 0.0 oz/week     Comment: social  . Drug use: No  . Sexual activity: Not on file   Other Topics Concern  . Not on file   Social History Narrative   Divorced.   1 son.   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    Enjoys reading, crafts.     Past Surgical History:  Procedure Laterality Date  . KIDNEY STONE SURGERY  2012    Family History  Problem Relation Age of Onset  . Alcohol abuse Father   . Mental illness Maternal Grandmother   . Diabetes Maternal Grandmother     No Known Allergies  Current Outpatient Prescriptions on File Prior to Visit  Medication Sig Dispense Refill  . amoxicillin (AMOXIL) 875 MG tablet Take 1 tablet (875 mg total) by mouth 2 (two)  times daily. 20 tablet 0   No current facility-administered medications on file prior to visit.     BP 118/72   Pulse 78   Temp 98.1 F (36.7 C) (Oral)   Ht 5\' 1"  (1.549 m)   Wt 165 lb 6.4 oz (75 kg)   SpO2 98%   BMI 31.25 kg/m    Objective:   Physical Exam  Constitutional: She appears well-nourished.  Neck: Neck supple.  Cardiovascular: Normal rate and regular rhythm.   Pulmonary/Chest: Effort normal and breath sounds normal. Right breast exhibits no mass, no skin change and no tenderness. Left breast exhibits no mass, no skin change and no tenderness.  Abdominal: Soft.  Genitourinary: There is no tenderness or lesion on the right labia. There is no tenderness or lesion on the left labia. Cervix exhibits no motion tenderness and no discharge. Right adnexum displays no tenderness. Left adnexum displays no tenderness. No vaginal discharge found.  Genitourinary Comments: IUD string visible   Skin: Skin is warm and dry.          Assessment & Plan:  Screening for Cervical Caner:  Pap due today.  Pelvic exam unremarkable. Ectropion noted to cervix. IUD string visible. No tenderness or discharge. Breast exam completed and is unremarkable. Pap results  pending.  Morrie Sheldon, NP

## 2016-03-13 NOTE — Patient Instructions (Signed)
We will notify you of your Pap results once received.   Complete monthly breast exams in the shower as discussed.  It was a pleasure to see you today!   Breast Self-Awareness Practicing breast self-awareness may pick up problems early, prevent significant medical complications, and possibly save your life. By practicing breast self-awareness, you can become familiar with how your breasts look and feel and if your breasts are changing. This allows you to notice changes early. It can also offer you some reassurance that your breast health is good. One way to learn what is normal for your breasts and whether your breasts are changing is to do a breast self-exam. If you find a lump or something that was not present in the past, it is best to contact your caregiver right away. Other findings that should be evaluated by your caregiver include nipple discharge, especially if it is bloody; skin changes or reddening; areas where the skin seems to be pulled in (retracted); or new lumps and bumps. Breast pain is seldom associated with cancer (malignancy), but should also be evaluated by a caregiver. HOW TO PERFORM A BREAST SELF-EXAM The best time to examine your breasts is 5-7 days after your menstrual period is over. During menstruation, the breasts are lumpier, and it may be more difficult to pick up changes. If you do not menstruate, have reached menopause, or had your uterus removed (hysterectomy), you should examine your breasts at regular intervals, such as monthly. If you are breastfeeding, examine your breasts after a feeding or after using a breast pump. Breast implants do not decrease the risk for lumps or tumors, so continue to perform breast self-exams as recommended. Talk to your caregiver about how to determine the difference between the implant and breast tissue. Also, talk about the amount of pressure you should use during the exam. Over time, you will become more familiar with the variations of your  breasts and more comfortable with the exam. A breast self-exam requires you to remove all your clothes above the waist. 1. Look at your breasts and nipples. Stand in front of a mirror in a room with good lighting. With your hands on your hips, push your hands firmly downward. Look for a difference in shape, contour, and size from one breast to the other (asymmetry). Asymmetry includes puckers, dips, or bumps. Also, look for skin changes, such as reddened or scaly areas on the breasts. Look for nipple changes, such as discharge, dimpling, repositioning, or redness. 2. Carefully feel your breasts. This is best done either in the shower or tub while using soapy water or when flat on your back. Place the arm (on the side of the breast you are examining) above your head. Use the pads (not the fingertips) of your three middle fingers on your opposite hand to feel your breasts. Start in the underarm area and use  inch (2 cm) overlapping circles to feel your breast. Use 3 different levels of pressure (light, medium, and firm pressure) at each circle before moving to the next circle. The light pressure is needed to feel the tissue closest to the skin. The medium pressure will help to feel breast tissue a little deeper, while the firm pressure is needed to feel the tissue close to the ribs. Continue the overlapping circles, moving downward over the breast until you feel your ribs below your breast. Then, move one finger-width towards the center of the body. Continue to use the  inch (2 cm) overlapping circles to feel  your breast as you move slowly up toward the collar bone (clavicle) near the base of the neck. Continue the up and down exam using all 3 pressures until you reach the middle of the chest. Do this with each breast, carefully feeling for lumps or changes. 3.  Keep a written record with breast changes or normal findings for each breast. By writing this information down, you do not need to depend only on memory  for size, tenderness, or location. Write down where you are in your menstrual cycle, if you are still menstruating. Breast tissue can have some lumps or thick tissue. However, see your caregiver if you find anything that concerns you.  SEEK MEDICAL CARE IF:  You see a change in shape, contour, or size of your breasts or nipples.   You see skin changes, such as reddened or scaly areas on the breasts or nipples.   You have an unusual discharge from your nipples.   You feel a new lump or unusually thick areas.    This information is not intended to replace advice given to you by your health care provider. Make sure you discuss any questions you have with your health care provider.   Document Released: 06/01/2005 Document Revised: 05/18/2012 Document Reviewed: 09/16/2011 Elsevier Interactive Patient Education [employer]

## 2016-03-13 NOTE — Progress Notes (Signed)
Pre visit review using our clinic review tool, if applicable. No additional management support is needed unless otherwise documented below in the visit note. 

## 2016-03-13 NOTE — Addendum Note (Signed)
Addended by: Tawnya CrookSAMBATH, Marsela Kuan on: 03/13/2016 10:44 AM   Modules accepted: Orders

## 2016-03-16 LAB — CYTOLOGY - PAP

## 2016-05-12 ENCOUNTER — Ambulatory Visit (INDEPENDENT_AMBULATORY_CARE_PROVIDER_SITE_OTHER): Payer: 59 | Admitting: Internal Medicine

## 2016-05-12 ENCOUNTER — Encounter: Payer: Self-pay | Admitting: Internal Medicine

## 2016-05-12 ENCOUNTER — Other Ambulatory Visit (HOSPITAL_COMMUNITY)
Admission: RE | Admit: 2016-05-12 | Discharge: 2016-05-12 | Disposition: A | Discharge status: Home / Self Care | Payer: 59 | Source: Ambulatory Visit | Attending: Internal Medicine | Admitting: Internal Medicine

## 2016-05-12 ENCOUNTER — Telehealth: Payer: Self-pay | Admitting: Primary Care

## 2016-05-12 VITALS — BP 120/68 | HR 111 | Temp 98.5°F | Wt 167.0 lb

## 2016-05-12 DIAGNOSIS — Z113 Encounter for screening for infections with a predominantly sexual mode of transmission: Secondary | ICD-10-CM | POA: Insufficient documentation

## 2016-05-12 DIAGNOSIS — R102 Pelvic and perineal pain: Secondary | ICD-10-CM | POA: Diagnosis not present

## 2016-05-12 DIAGNOSIS — Z01419 Encounter for gynecological examination (general) (routine) without abnormal findings: Secondary | ICD-10-CM | POA: Insufficient documentation

## 2016-05-12 DIAGNOSIS — N72 Inflammatory disease of cervix uteri: Secondary | ICD-10-CM | POA: Diagnosis not present

## 2016-05-12 DIAGNOSIS — Z124 Encounter for screening for malignant neoplasm of cervix: Secondary | ICD-10-CM

## 2016-05-12 NOTE — Telephone Encounter (Signed)
[facility] Night - Client TELEPHONE ADVICE RECORD [facility]  Patient Name: Rachel Mann  DOB: 05/21/1991    Initial Comment Caller states c/o abdominal pain and feels like something is stuck in her vagina that is very painful.   Nurse Assessment  Nurse: Laural BenesJohnson, RN, Dondra SpryGail Date/Time Lamount Cohen(Eastern Time): 05/12/2016 8:51:49 AM  Confirm and document reason for call. If symptomatic, describe symptoms. You must click the next button to save text entered. ---[name] is having abdominal (lower) pain feels like there is something in the vaginal area -- when sitting down has pain in vaginal area and rectal area. 3 x days  Does the patient have any new or worsening symptoms? ---Yes  Will a triage be completed? ---Yes  Related visit to physician within the last 2 weeks? ---No  Does the PT have any chronic conditions? (i.e. diabetes, asthma, etc.) ---No  Is the patient pregnant or possibly pregnant? (Ask all females between the ages of 912-55) ---No  Is this a behavioral health or substance abuse call? ---No     Guidelines    Guideline Title Affirmed Question Affirmed Notes  Abdominal Pain - Female [1] MILD-MODERATE pain AND [2] constant AND [3] present > 2 hours    Final Disposition User   See Physician within 4 Hours (or PCP triage) Laural BenesJohnson, RN, Dondra SpryGail    Comments  NOTE: 11/28-2017 Vernona RiegerKatherine Clark had no openings today and has 4 hour outcome; 11-28/2017 given appointment for Huey Bienenstock. Ba[place] NP at 115pm today [facility].   Referrals  REFERRED TO PCP OFFICE   Disagree/Comply: Comply

## 2016-05-12 NOTE — Progress Notes (Signed)
Subjective:    Patient ID: Rachel Mann, female    DOB: 1990-12-16, 25 y.o.   MRN: [id number]  HPI  Pt presents to the clinic today with c/o pelvic pain. This started 3 days ago. She describes the pain sharp and stabbing. She feels pressure, like "something is stuck inside my vagina". The pain increase in the vaginal/rectal area with sitting. She denies any abnormal bleeding. She denies urinary urgency, frequency and dysuria. Her bowels are moving normally. She has an IUD which was inserted 3 years ago, but denies having any problems with that. She has a history of kidney stones but reports this feels different. She has not been sexually active in the last 3 months and has no concerns about STD's.  Review of Systems  Past Medical History:  Diagnosis Date  . Chickenpox   . Depression   . Kidney stone   . UTI (lower urinary tract infection)     No current outpatient prescriptions on file.   No current facility-administered medications for this visit.     No Known Allergies  Family History  Problem Relation Age of Onset  . Alcohol abuse Father   . Mental illness Maternal Grandmother   . Diabetes Maternal Grandmother     Social History   Social History  . Marital status: Single    Spouse name: N/A  . Number of children: N/A  . Years of education: N/A   Occupational History  . Not on file.   Social History Main Topics  . Smoking status: Current Every Day Smoker    Packs/day: 0.25    Types: Cigarettes  . Smokeless tobacco: Never Used  . Alcohol use 0.0 oz/week     Comment: social  . Drug use: No  . Sexual activity: Not on file   Other Topics Concern  . Not on file   Social History Narrative   Divorced.   1 son.   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    Enjoys reading, crafts.      Constitutional: Denies fever, malaise, fatigue, headache or abrupt weight changes.   Cardiovascular: Denies chest pain, chest tightness,  palpitations or swelling in the hands or feet.  Gastrointestinal: Pt reports pelvic pain. Denies bloating, constipation, diarrhea or blood in the stool.  GU: Denies urgency, frequency, pain with urination, burning sensation, blood in urine, odor or discharge.  No other specific complaints in a complete review of systems (except as listed in HPI above).     Objective:   Physical Exam   BP 120/68   Pulse (!) 111   Temp 98.5 F (36.9 C) (Oral)   Wt 167 lb (75.8 kg)   SpO2 98%   BMI 31.55 kg/m  Wt Readings from Last 3 Encounters:  05/12/16 167 lb (75.8 kg)  03/13/16 165 lb 6.4 oz (75 kg)  03/04/16 155 lb (70.3 kg)    General: Appears her stated age, obese in NAD. Abdomen: Soft and tender in the suprapubic area. Normal bowel sounds. No distention or masses noted.  Pelvic: Normal female anatomy. Cervix irritated from 12 oclock to 4 oclock. White/yellow discharge noted. IUD strings noted. No CMT. Pain with bimanual exam but adnexa non palpable.  BMET    Component Value Date/Time   NA 137 02/07/2016 0753   K 4.0 02/07/2016 0753   CL 104 02/07/2016 0753   CO2 28 02/07/2016 0753   GLUCOSE 94 02/07/2016 0753   BUN 13 02/07/2016 0753  CREATININE 0.79 02/07/2016 0753   CALCIUM 8.9 02/07/2016 0753   GFRNONAA >60 11/12/2015 1110   GFRAA >60 11/12/2015 1110    Lipid Panel     Component Value Date/Time   CHOL 160 02/07/2016 0753   TRIG 125.0 02/07/2016 0753   HDL 31.70 (L) 02/07/2016 0753   CHOLHDL 5 02/07/2016 0753   VLDL 25.0 02/07/2016 0753   LDLCALC 103 (H) 02/07/2016 0753    CBC    Component Value Date/Time   WBC 8.7 11/12/2015 1110   RBC 4.73 11/12/2015 1110   HGB 14.1 11/12/2015 1110   HCT 41.7 11/12/2015 1110   PLT 214 11/12/2015 1110   MCV 88.1 11/12/2015 1110   MCH 29.8 11/12/2015 1110   MCHC 33.8 11/12/2015 1110   RDW 13.4 11/12/2015 1110    Hgb A1C Lab Results  Component Value Date   HGBA1C 5.5 12/30/2015          Assessment & Plan:    Pelvic pain, cervicitis:  Pap obtained with STD testing Urinalysis: normal Ibuprofen as needed, avoid sexual activity over the next few days If pain persist, will get pelvic/transvaginal ultrasound to check IUD  Will follow up after labs, RTC as needed Nicki ReaperBAITY, Naraya Stoneberg, NP

## 2016-05-12 NOTE — Patient Instructions (Signed)
Cervicitis Cervicitis is when the cervix gets irritated and swollen. Your cervix is the lower end of your uterus. Follow these instructions at home:  Do not have sex until your doctor says it is okay.  Take over-the-counter and prescription medicines only as told by your doctor.  If you were prescribed an antibiotic medicine, take it as told by your doctor. Do not stop taking it even if you start to feel better.  Keep all follow-up visits as told by your doctor. This is important. Contact a doctor if:  Your symptoms come back after treatment.  Your symptoms get worse after treatment.  You have a fever.  You feel tired (fatigued).  Your belly (abdomen) hurts.  You feel like you are going to throw up (are nauseous).  You throw up (vomit).  You have watery poop (diarrhea).  Your back hurts. Get help right away if:  You have very bad pain in your belly, and medicine does not help it.  You cannot pee (urinate). Summary  Cervicitis is when the cervix gets irritated and swollen.  Do not have sex until your doctor says it is okay.  If you need to take an antibiotic, do not stop taking even if you start to feel better. Take medicines only as told by your doctor. This information is not intended to replace advice given to you by your health care provider. Make sure you discuss any questions you have with your health care provider. Document Released: 03/10/2008 Document Revised: 02/16/2016 Document Reviewed: 02/16/2016 Elsevier Interactive Patient Education  2017 Elsevier Inc.  

## 2016-05-12 NOTE — Addendum Note (Signed)
Addended by: Littie DeedsEVONTENNO, Fredia Chittenden Y on: 05/12/2016 02:00 PM   Modules accepted: Orders

## 2016-05-12 NOTE — Telephone Encounter (Signed)
Noted  

## 2016-05-13 LAB — CYTOLOGY - PAP
Chlamydia: NEGATIVE
Diagnosis: NEGATIVE
NEISSERIA GONORRHEA: NEGATIVE
Trichomonas: NEGATIVE

## 2016-05-14 ENCOUNTER — Encounter: Payer: Self-pay | Admitting: Internal Medicine

## 2016-05-15 LAB — CERVICOVAGINAL ANCILLARY ONLY
Bacterial vaginitis: NEGATIVE
Candida vaginitis: NEGATIVE

## 2016-05-16 ENCOUNTER — Other Ambulatory Visit: Payer: Self-pay | Admitting: Primary Care

## 2016-05-16 DIAGNOSIS — R102 Pelvic and perineal pain: Secondary | ICD-10-CM

## 2016-05-25 ENCOUNTER — Encounter: Payer: Self-pay | Admitting: Primary Care

## 2016-06-09 ENCOUNTER — Encounter: Payer: Self-pay | Admitting: Obstetrics and Gynecology

## 2016-06-09 ENCOUNTER — Encounter: Payer: 59 | Admitting: Obstetrics and Gynecology

## 2016-06-09 DIAGNOSIS — Z01419 Encounter for gynecological examination (general) (routine) without abnormal findings: Secondary | ICD-10-CM

## 2016-06-09 NOTE — Progress Notes (Signed)
Patient did not keep GYN referral appointment for 06/09/2016.  Cornelia Copaharlie Layten Aiken, Jr MD Attending Center [facility](Faculty Practice)

## 2016-07-06 ENCOUNTER — Ambulatory Visit
Admission: EM | Admit: 2016-07-06 | Discharge: 2016-07-06 | Disposition: A | Discharge status: Home / Self Care | Payer: 59 | Attending: Family Medicine | Admitting: Family Medicine

## 2016-07-06 DIAGNOSIS — J020 Streptococcal pharyngitis: Secondary | ICD-10-CM

## 2016-07-06 LAB — RAPID STREP SCREEN (MED CTR MEBANE ONLY): Streptococcus, Group A Screen (Direct): POSITIVE — AB

## 2016-07-06 MED ORDER — AMOXICILLIN 875 MG PO TABS: 875.0000 mg | ORAL_TABLET | Freq: Two times a day (BID) | ORAL | 0 refills | Status: AC

## 2016-07-06 MED ORDER — DEXAMETHASONE SODIUM PHOSPHATE 10 MG/ML IJ SOLN
8.0000 mg | Freq: Once | INTRAMUSCULAR | Status: AC
  Administered 2016-07-06: 8 mg via INTRAMUSCULAR

## 2016-07-06 MED ORDER — DEXAMETHASONE SODIUM PHOSPHATE 10 MG/ML IJ SOLN: 10.0000 mg | Freq: Once | INTRAMUSCULAR | Status: DC

## 2016-07-06 NOTE — ED Provider Notes (Signed)
[facility] ____________________________________________  Time seen: Approximately 9:13 AM  I have reviewed the triage vital signs and the nursing notes.   HISTORY  Chief Complaint Sore Throat   HPI Rachel Mann is a 26 y.o. female presents for complaint of sore throat since yesterday. Patient reports pain with swallowing but able to swallow. Reports has continued drinking fluids well, pain with eating. Reports some decreased appetite. Reports some chills and body aches, denies known fevers. Denies known sick contacts. Reports she has not been taking over-the-counter medications for the same complaint. Reports mild nasal congestion, but states she often has nasal congestion. Denies cough. Reports she has a history of strep throat,with last being in 2017, and reports this feels the same.  Denies chest pain, shortness of breath, abdominal pain, dysuria, extremity pain or extremity swelling. Denies rash. Denies for back pain. Denies recent sickness or recent antibiotic use.  No LMP recorded. Patient is not currently having periods (Reason: IUD). Denies pregnancy.  Morrie Sheldon, NP: PCP   Past Medical History:  Diagnosis Date  . Chickenpox   . Depression   . Kidney stone   . UTI (lower urinary tract infection)     Patient Active Problem List   Diagnosis Date Noted  . Preventative health care 02/07/2016  . Frequent headaches 12/30/2015  . Seasonal allergies 12/13/2015  . Anxiety and depression 09/20/2015    Past Surgical History:  Procedure Laterality Date  . KIDNEY STONE SURGERY  2012    Current Outpatient Rx  . Order #: [id number] Class: Normal     Current Facility-Administered Medications:  .  dexamethasone (DECADRON) injection 10 mg, 10 mg, Intramuscular, Once, Renford Dills, NP  Current Outpatient Prescriptions:  .  amoxicillin (AMOXIL) 875 MG tablet, Take 1 tablet (875 mg total) by mouth 2 (two) times daily., Disp: 20 tablet, Rfl:  0  Allergies Patient has no known allergies.  Family History  Problem Relation Age of Onset  . Alcohol abuse Father   . Mental illness Maternal Grandmother   . Diabetes Maternal Grandmother     Social History Social History  Substance Use Topics  . Smoking status: Current Every Day Smoker    Packs/day: 0.25    Types: Cigarettes  . Smokeless tobacco: Never Used  . Alcohol use 0.0 oz/week     Comment: social    Review of Systems Constitutional:As above.  Eyes: No visual changes. WUJ:WJXBJYNW for sore throat. Cardiovascular: Denies chest pain. Respiratory: Denies shortness of breath. Gastrointestinal: No abdominal pain.  No nausea, no vomiting.  No diarrhea.  No constipation. Genitourinary: Negative for dysuria. Musculoskeletal: Negative for back pain. Skin: Negative for rash. Neurological: Negative for headaches, focal weakness or numbness.  10-point ROS otherwise negative.  ____________________________________________   PHYSICAL EXAM:  VITAL SIGNS: ED Triage Vitals  Enc Vitals Group     BP 07/06/16 0852 112/68     Pulse Rate 07/06/16 0852 (!) 128 Recheck 105     Resp 07/06/16 0852 18     Temp 07/06/16 0852 98.6 F (37 C)     Temp Source 07/06/16 0852 Oral     SpO2 07/06/16 0852 99 %     Weight 07/06/16 0853 160 lb (72.6 kg)     Height 07/06/16 0853 5\' 1"  (1.549 m)     Head Circumference --      Peak Flow --      Pain Score 07/06/16 0853 6     Pain Loc --      Pain  Edu? --      Excl. in GC? --   Patient states that she often has anxiety when coming to the doctor's office causing her heart rate to elevate. Patient reports history of this. Denies chest pain, shortness of breath, palpitations, dizziness or weakness. Patient reports that she used to take propanolol for this, but is no longer taking as directed by her primary physician.  Constitutional: Alert and oriented. Well appearing and in no acute distress. Eyes: Conjunctivae are normal. PERRL.  EOMI. Head: Atraumatic. No sinus tenderness to palpation. No swelling. No erythema.  Ears: no erythema, normal TMs bilaterally.   Nose: No nasal congestion or rhinorrhea.  Mouth/Throat: Mucous membranes are moist. Moderate pharyngeal erythema with 2+ bilateral tonsillar swelling. No exudate visible. No uvular shift or deviation. Neck: No stridor.  No cervical spine tenderness to palpation. Hematological/Lymphatic/Immunilogical: Anterior bilateral cervical lymphadenopathy. Cardiovascular: Regular rhythm. Grossly normal heart sounds.  Good peripheral circulation. Respiratory: Normal respiratory effort.  No retractions. No wheezes, rales or rhonchi. Good air movement.  Gastrointestinal: Soft and nontender. No CVA tenderness. No hepatosplenomegaly palpated. Musculoskeletal: Ambulatory with steady gait. No cervical, thoracic or lumbar tenderness to palpation. Neurologic:  Normal speech and language.  No gait instability. Skin:  Skin is warm, dry and intact. No rash noted. Psychiatric: Mood and affect are normal. Speech and behavior are normal. ___________________________________________   LABS (all labs ordered are listed, but only abnormal results are displayed)  Labs Reviewed  RAPID STREP SCREEN (NOT AT [facility]) - Abnormal; Notable for the following:       Result Value   Streptococcus, Group A Screen (Direct) POSITIVE (*)    All other components within normal limits     PROCEDURES Procedures   INITIAL IMPRESSION / ASSESSMENT AND PLAN / ED COURSE  Pertinent labs & imaging results that were available during my care of the patient were reviewed by me and considered in my medical decision making (see chart for details).  Well-appearing patient. No acute distress. Presents for sore throat. Quick strep positive. Discussed options of treatment with patient. Patient requests oral medication antibiotics. Also discussed use of IM Decadron, 8 mg IM Decadron given once in urgent care. Will treat  patient with oral amoxicillin. Encouraged supportive care, rest, fluids, gargles, lozenges, over-the-counter Tylenol or ibuprofen as needed. Discussed with patient monitoring heart rate and following up with primary care physician. Work note given for today, declines needing work note for tomorrow as she works from home.Discussed indication, risks and benefits of medications with patient.  Discussed follow up with Primary care physician this week. Discussed follow up and return parameters including no resolution or any worsening concerns. Patient verbalized understanding and agreed to plan.   ____________________________________________   FINAL CLINICAL IMPRESSION(S) / ED DIAGNOSES  Final diagnoses:  Strep pharyngitis     New Prescriptions   AMOXICILLIN (AMOXIL) 875 MG TABLET    Take 1 tablet (875 mg total) by mouth 2 (two) times daily.    Note: This dictation was prepared with Dragon dictation along with smaller phrase technology. Any transcriptional errors that result from this process are unintentional.         Renford DillsLindsey Ceylon Arenson, NP 07/06/16 1046    Renford DillsLindsey Santosha Jividen, NP 07/06/16 1047

## 2016-07-06 NOTE — Discharge Instructions (Signed)
Take medication as prescribed. Rest. Drink plenty of fluids.  ° °Follow up with your primary care physician this week as needed. Return to Urgent care for new or worsening concerns.  ° °

## 2016-07-06 NOTE — ED Triage Notes (Signed)
Pt c/o sore throat since yesterday. It hurts to swallow and says she has white patches on her throat. She was treated for strep a few months ago.

## 2016-09-09 ENCOUNTER — Ambulatory Visit: Payer: 59

## 2016-09-16 ENCOUNTER — Ambulatory Visit (INDEPENDENT_AMBULATORY_CARE_PROVIDER_SITE_OTHER): Payer: 59 | Admitting: *Deleted

## 2016-09-16 DIAGNOSIS — Z111 Encounter for screening for respiratory tuberculosis: Secondary | ICD-10-CM

## 2016-09-18 LAB — TB SKIN TEST
Induration: 0 mm
TB SKIN TEST: NEGATIVE

## 2018-04-21 IMAGING — CR DG CHEST 2V
1 series · 2 of 2 positions shown · non-contrast
Comparison: None.

CLINICAL DATA: Left chest pain.  Smoker

EXAM:
CHEST  2 VIEW

[Series 1: w chest pa · 0.14mm/px · 2 of 2 slices shown]
[im 1/2]
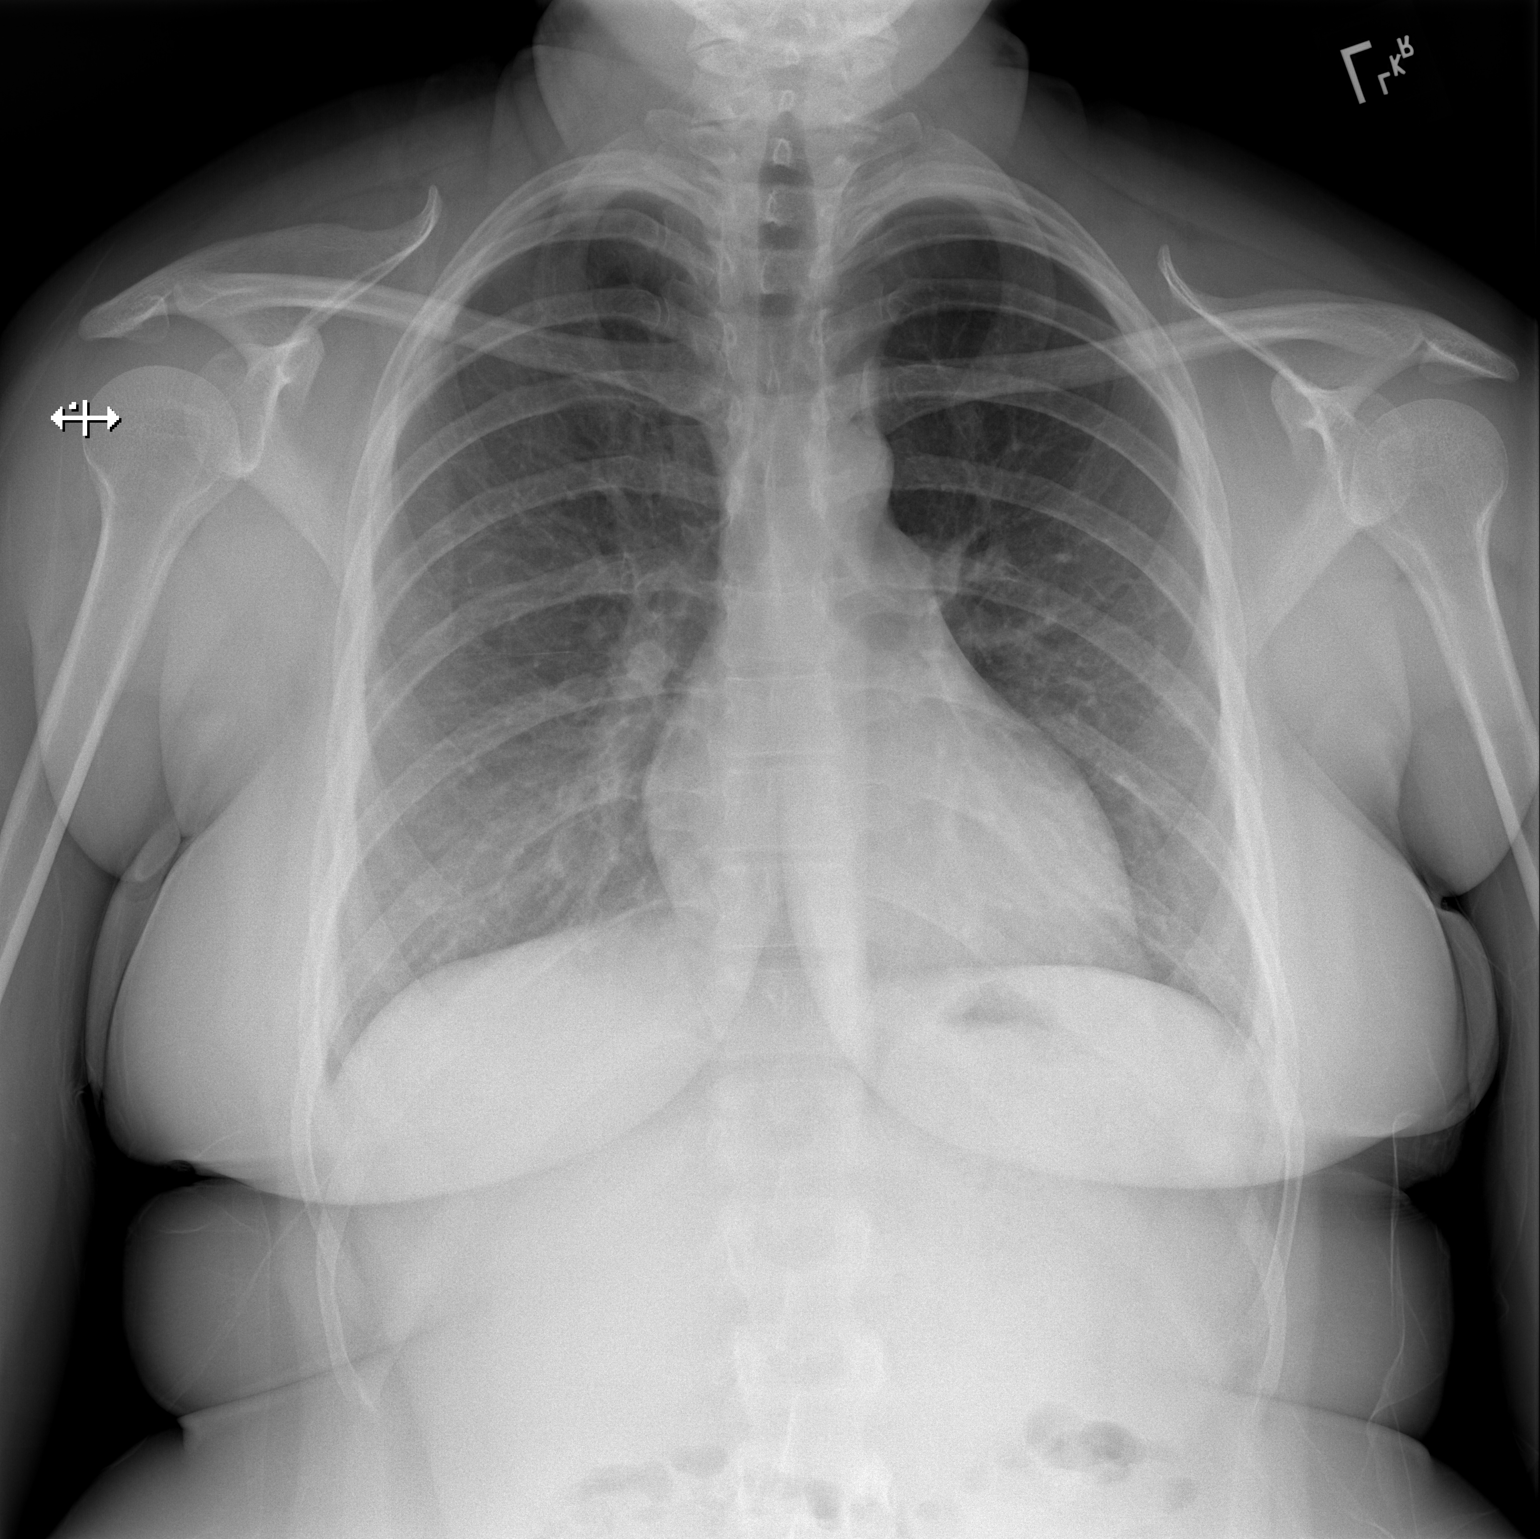
[im 2/2]
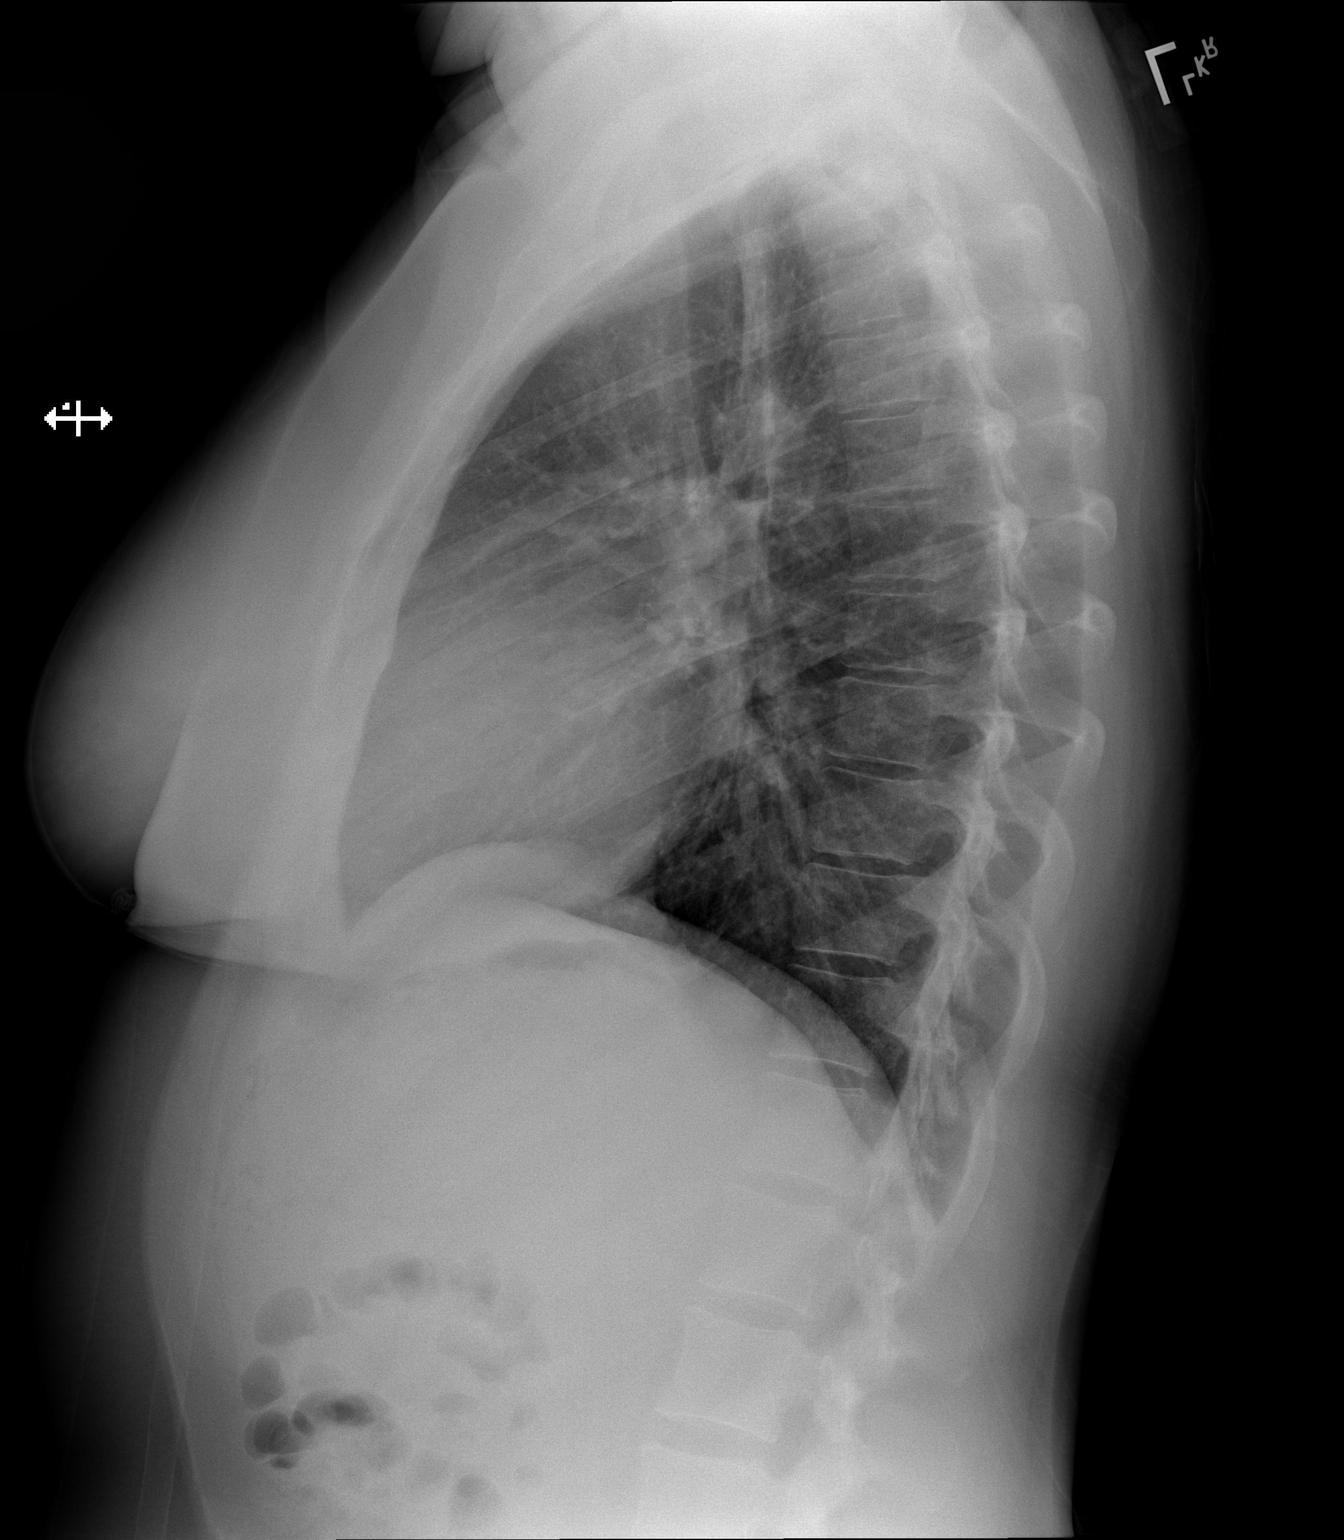

[2 of 2 positions shown; findings below may reference images not displayed]

FINDINGS: The heart size and mediastinal contours are within normal limits.
Both lungs are clear. The visualized skeletal structures are
unremarkable.
IMPRESSION: No active cardiopulmonary disease.
